# Patient Record
Sex: Female | Born: 1950 | Race: Black or African American | Hispanic: No | Smoking: Former smoker
Health system: Southern US, Community
[De-identification: ages and names within clinical notes are randomized; demographics above are authoritative.]

## PROBLEM LIST (undated history)

## (undated) DIAGNOSIS — M25552 Pain in left hip: Secondary | ICD-10-CM

## (undated) DIAGNOSIS — G5600 Carpal tunnel syndrome, unspecified upper limb: Secondary | ICD-10-CM

## (undated) DIAGNOSIS — B370 Candidal stomatitis: Secondary | ICD-10-CM

## (undated) DIAGNOSIS — G4730 Sleep apnea, unspecified: Secondary | ICD-10-CM

## (undated) DIAGNOSIS — N3281 Overactive bladder: Secondary | ICD-10-CM

## (undated) DIAGNOSIS — F329 Major depressive disorder, single episode, unspecified: Secondary | ICD-10-CM

## (undated) DIAGNOSIS — G40909 Epilepsy, unspecified, not intractable, without status epilepticus: Secondary | ICD-10-CM

## (undated) DIAGNOSIS — J40 Bronchitis, not specified as acute or chronic: Secondary | ICD-10-CM

## (undated) DIAGNOSIS — W19XXXA Unspecified fall, initial encounter: Secondary | ICD-10-CM

## (undated) DIAGNOSIS — D259 Leiomyoma of uterus, unspecified: Secondary | ICD-10-CM

## (undated) DIAGNOSIS — M1990 Unspecified osteoarthritis, unspecified site: Secondary | ICD-10-CM

## (undated) DIAGNOSIS — K589 Irritable bowel syndrome without diarrhea: Secondary | ICD-10-CM

## (undated) DIAGNOSIS — Z0000 Encounter for general adult medical examination without abnormal findings: Secondary | ICD-10-CM

## (undated) DIAGNOSIS — I499 Cardiac arrhythmia, unspecified: Secondary | ICD-10-CM

## (undated) DIAGNOSIS — D649 Anemia, unspecified: Secondary | ICD-10-CM

## (undated) DIAGNOSIS — G5620 Lesion of ulnar nerve, unspecified upper limb: Secondary | ICD-10-CM

## (undated) DIAGNOSIS — I10 Essential (primary) hypertension: Secondary | ICD-10-CM

## (undated) DIAGNOSIS — E119 Type 2 diabetes mellitus without complications: Secondary | ICD-10-CM

## (undated) DIAGNOSIS — E785 Hyperlipidemia, unspecified: Secondary | ICD-10-CM

## (undated) DIAGNOSIS — R32 Unspecified urinary incontinence: Secondary | ICD-10-CM

## (undated) DIAGNOSIS — S90121A Contusion of right lesser toe(s) without damage to nail, initial encounter: Secondary | ICD-10-CM

## (undated) DIAGNOSIS — F32A Depression, unspecified: Secondary | ICD-10-CM

## (undated) DIAGNOSIS — K219 Gastro-esophageal reflux disease without esophagitis: Secondary | ICD-10-CM

## (undated) DIAGNOSIS — Z79899 Other long term (current) drug therapy: Secondary | ICD-10-CM

## (undated) DIAGNOSIS — K120 Recurrent oral aphthae: Secondary | ICD-10-CM

## (undated) HISTORY — DX: Recurrent oral aphthae: K12.0

## (undated) HISTORY — PX: BREAST EXCISIONAL BIOPSY: SUR124

## (undated) HISTORY — DX: Hyperlipidemia, unspecified: E78.5

## (undated) HISTORY — DX: Cardiac arrhythmia, unspecified: I49.9

## (undated) HISTORY — DX: Pain in left hip: M25.552

## (undated) HISTORY — DX: Epilepsy, unspecified, not intractable, without status epilepticus: G40.909

## (undated) HISTORY — DX: Overactive bladder: N32.81

## (undated) HISTORY — DX: Irritable bowel syndrome, unspecified: K58.9

## (undated) HISTORY — DX: Lesion of ulnar nerve, unspecified upper limb: G56.20

## (undated) HISTORY — DX: Carpal tunnel syndrome, unspecified upper limb: G56.00

## (undated) HISTORY — DX: Bronchitis, not specified as acute or chronic: J40

## (undated) HISTORY — DX: Morbid (severe) obesity due to excess calories: E66.01

## (undated) HISTORY — DX: Anemia, unspecified: D64.9

## (undated) HISTORY — DX: Encounter for general adult medical examination without abnormal findings: Z00.00

## (undated) HISTORY — DX: Unspecified fall, initial encounter: W19.XXXA

## (undated) HISTORY — DX: Type 2 diabetes mellitus without complications: E11.9

## (undated) HISTORY — PX: KNEE ARTHROSCOPY: SUR90

## (undated) HISTORY — PX: CARPAL TUNNEL RELEASE: SHX101

## (undated) HISTORY — DX: Leiomyoma of uterus, unspecified: D25.9

## (undated) HISTORY — DX: Other long term (current) drug therapy: Z79.899

## (undated) HISTORY — PX: CHOLECYSTECTOMY: SHX55

## (undated) HISTORY — PX: DILATION AND CURETTAGE OF UTERUS: SHX78

## (undated) HISTORY — DX: Unspecified urinary incontinence: R32

## (undated) HISTORY — DX: Sleep apnea, unspecified: G47.30

## (undated) HISTORY — DX: Gastro-esophageal reflux disease without esophagitis: K21.9

## (undated) HISTORY — DX: Major depressive disorder, single episode, unspecified: F32.9

## (undated) HISTORY — DX: Depression, unspecified: F32.A

## (undated) HISTORY — DX: Contusion of right lesser toe(s) without damage to nail, initial encounter: S90.121A

## (undated) HISTORY — DX: Essential (primary) hypertension: I10

## (undated) HISTORY — DX: Unspecified osteoarthritis, unspecified site: M19.90

## (undated) HISTORY — PX: HEMORROIDECTOMY: SUR656

## (undated) HISTORY — DX: Candidal stomatitis: B37.0

## (undated) HISTORY — PX: HEEL SPUR SURGERY: SHX665

## (undated) HISTORY — PX: TONSILLECTOMY: SUR1361

---

## 1981-11-07 HISTORY — PX: NASAL SINUS SURGERY: SHX719

## 2000-02-16 ENCOUNTER — Ambulatory Visit (HOSPITAL_COMMUNITY): Admission: RE | Admit: 2000-02-16 | Discharge: 2000-02-16 | Payer: Self-pay | Admitting: Cardiology

## 2000-02-17 ENCOUNTER — Encounter: Payer: Self-pay | Admitting: Cardiology

## 2000-06-14 ENCOUNTER — Ambulatory Visit (HOSPITAL_BASED_OUTPATIENT_CLINIC_OR_DEPARTMENT_OTHER): Admission: RE | Admit: 2000-06-14 | Discharge: 2000-06-14 | Payer: Self-pay | Admitting: Cardiology

## 2001-09-07 ENCOUNTER — Ambulatory Visit (HOSPITAL_COMMUNITY): Admission: RE | Admit: 2001-09-07 | Discharge: 2001-09-08 | Payer: Self-pay | Admitting: *Deleted

## 2001-11-15 ENCOUNTER — Ambulatory Visit (HOSPITAL_COMMUNITY): Admission: RE | Admit: 2001-11-15 | Discharge: 2001-11-15 | Payer: Self-pay | Admitting: Family Medicine

## 2001-11-15 ENCOUNTER — Encounter: Payer: Self-pay | Admitting: Family Medicine

## 2002-10-14 ENCOUNTER — Ambulatory Visit (HOSPITAL_COMMUNITY): Admission: RE | Admit: 2002-10-14 | Discharge: 2002-10-14 | Payer: Self-pay | Admitting: Family Medicine

## 2002-10-14 ENCOUNTER — Encounter: Payer: Self-pay | Admitting: Family Medicine

## 2004-02-23 ENCOUNTER — Other Ambulatory Visit: Admission: RE | Admit: 2004-02-23 | Discharge: 2004-02-23 | Payer: Self-pay | Admitting: *Deleted

## 2004-03-04 ENCOUNTER — Ambulatory Visit (HOSPITAL_COMMUNITY): Admission: RE | Admit: 2004-03-04 | Discharge: 2004-03-04 | Payer: Self-pay | Admitting: Unknown Physician Specialty

## 2005-07-21 ENCOUNTER — Ambulatory Visit (HOSPITAL_COMMUNITY): Admission: RE | Admit: 2005-07-21 | Discharge: 2005-07-21 | Payer: Self-pay | Admitting: Family Medicine

## 2005-08-19 ENCOUNTER — Emergency Department (HOSPITAL_COMMUNITY): Admission: EM | Admit: 2005-08-19 | Discharge: 2005-08-19 | Payer: Self-pay | Admitting: *Deleted

## 2005-12-15 ENCOUNTER — Ambulatory Visit: Payer: Self-pay | Admitting: Orthopedic Surgery

## 2006-08-14 ENCOUNTER — Ambulatory Visit: Payer: Self-pay | Admitting: Family Medicine

## 2006-09-11 ENCOUNTER — Ambulatory Visit: Payer: Self-pay | Admitting: Family Medicine

## 2006-10-04 ENCOUNTER — Ambulatory Visit: Payer: Self-pay | Admitting: Family Medicine

## 2006-10-09 ENCOUNTER — Ambulatory Visit (HOSPITAL_COMMUNITY): Admission: RE | Admit: 2006-10-09 | Discharge: 2006-10-09 | Payer: Self-pay | Admitting: Family Medicine

## 2006-10-10 DIAGNOSIS — G5600 Carpal tunnel syndrome, unspecified upper limb: Secondary | ICD-10-CM | POA: Insufficient documentation

## 2006-10-10 DIAGNOSIS — D259 Leiomyoma of uterus, unspecified: Secondary | ICD-10-CM | POA: Insufficient documentation

## 2006-10-10 DIAGNOSIS — J45909 Unspecified asthma, uncomplicated: Secondary | ICD-10-CM | POA: Insufficient documentation

## 2006-10-10 DIAGNOSIS — E785 Hyperlipidemia, unspecified: Secondary | ICD-10-CM | POA: Insufficient documentation

## 2006-10-10 DIAGNOSIS — N318 Other neuromuscular dysfunction of bladder: Secondary | ICD-10-CM | POA: Insufficient documentation

## 2006-10-10 DIAGNOSIS — K219 Gastro-esophageal reflux disease without esophagitis: Secondary | ICD-10-CM | POA: Insufficient documentation

## 2006-10-10 DIAGNOSIS — J309 Allergic rhinitis, unspecified: Secondary | ICD-10-CM | POA: Insufficient documentation

## 2006-10-10 DIAGNOSIS — I10 Essential (primary) hypertension: Secondary | ICD-10-CM | POA: Insufficient documentation

## 2006-10-10 DIAGNOSIS — M129 Arthropathy, unspecified: Secondary | ICD-10-CM | POA: Insufficient documentation

## 2006-10-10 DIAGNOSIS — K589 Irritable bowel syndrome without diarrhea: Secondary | ICD-10-CM | POA: Insufficient documentation

## 2006-10-10 DIAGNOSIS — H269 Unspecified cataract: Secondary | ICD-10-CM | POA: Insufficient documentation

## 2006-11-15 ENCOUNTER — Ambulatory Visit: Payer: Self-pay | Admitting: Family Medicine

## 2006-11-16 ENCOUNTER — Encounter (INDEPENDENT_AMBULATORY_CARE_PROVIDER_SITE_OTHER): Payer: Self-pay | Admitting: Family Medicine

## 2006-11-16 LAB — CONVERTED CEMR LAB
AST: 17 units/L (ref 0–37)
Albumin: 4.5 g/dL (ref 3.5–5.2)
CO2: 20 meq/L (ref 19–32)
Calcium: 9.5 mg/dL (ref 8.4–10.5)
Chloride: 103 meq/L (ref 96–112)
Creatinine, Ser: 0.72 mg/dL (ref 0.40–1.20)
Potassium: 4.3 meq/L (ref 3.5–5.3)
Sodium: 141 meq/L (ref 135–145)
Total Bilirubin: 0.6 mg/dL (ref 0.3–1.2)

## 2006-12-27 ENCOUNTER — Ambulatory Visit: Payer: Self-pay | Admitting: Family Medicine

## 2006-12-27 DIAGNOSIS — E119 Type 2 diabetes mellitus without complications: Secondary | ICD-10-CM | POA: Insufficient documentation

## 2006-12-27 LAB — CONVERTED CEMR LAB: Hgb A1c MFr Bld: 6.1 %

## 2007-01-03 ENCOUNTER — Encounter (INDEPENDENT_AMBULATORY_CARE_PROVIDER_SITE_OTHER): Payer: Self-pay | Admitting: Family Medicine

## 2007-01-09 ENCOUNTER — Ambulatory Visit: Payer: Self-pay | Admitting: Family Medicine

## 2007-01-09 ENCOUNTER — Telehealth (INDEPENDENT_AMBULATORY_CARE_PROVIDER_SITE_OTHER): Payer: Self-pay | Admitting: Family Medicine

## 2007-01-09 LAB — CONVERTED CEMR LAB
Cholesterol: 178 mg/dL (ref 0–200)
LDL Cholesterol: 117 mg/dL — ABNORMAL HIGH (ref 0–99)
Total CHOL/HDL Ratio: 4.2
Triglycerides: 93 mg/dL (ref <150)
VLDL: 19 mg/dL (ref 0–40)

## 2007-01-29 ENCOUNTER — Encounter (INDEPENDENT_AMBULATORY_CARE_PROVIDER_SITE_OTHER): Payer: Self-pay | Admitting: Family Medicine

## 2007-02-22 ENCOUNTER — Ambulatory Visit: Payer: Self-pay | Admitting: Family Medicine

## 2007-02-22 LAB — CONVERTED CEMR LAB
Glucose, Bld: 118 mg/dL
Hgb A1c MFr Bld: 5.9 %

## 2007-04-16 ENCOUNTER — Telehealth (INDEPENDENT_AMBULATORY_CARE_PROVIDER_SITE_OTHER): Payer: Self-pay | Admitting: Family Medicine

## 2007-04-20 ENCOUNTER — Ambulatory Visit (HOSPITAL_COMMUNITY): Admission: RE | Admit: 2007-04-20 | Discharge: 2007-04-20 | Payer: Self-pay | Admitting: Family Medicine

## 2007-04-20 ENCOUNTER — Ambulatory Visit: Payer: Self-pay | Admitting: Cardiology

## 2007-04-20 ENCOUNTER — Ambulatory Visit: Payer: Self-pay | Admitting: Family Medicine

## 2007-04-23 ENCOUNTER — Encounter (INDEPENDENT_AMBULATORY_CARE_PROVIDER_SITE_OTHER): Payer: Self-pay | Admitting: Family Medicine

## 2007-04-27 ENCOUNTER — Telehealth (INDEPENDENT_AMBULATORY_CARE_PROVIDER_SITE_OTHER): Payer: Self-pay | Admitting: *Deleted

## 2007-04-27 ENCOUNTER — Ambulatory Visit: Payer: Self-pay | Admitting: Family Medicine

## 2007-04-30 ENCOUNTER — Ambulatory Visit (HOSPITAL_COMMUNITY): Admission: RE | Admit: 2007-04-30 | Discharge: 2007-04-30 | Payer: Self-pay | Admitting: Family Medicine

## 2007-05-03 ENCOUNTER — Encounter (INDEPENDENT_AMBULATORY_CARE_PROVIDER_SITE_OTHER): Payer: Self-pay | Admitting: Family Medicine

## 2007-05-04 ENCOUNTER — Encounter (HOSPITAL_COMMUNITY): Admission: RE | Admit: 2007-05-04 | Discharge: 2007-06-03 | Payer: Self-pay | Admitting: Cardiology

## 2007-05-04 ENCOUNTER — Ambulatory Visit: Payer: Self-pay | Admitting: Cardiology

## 2007-05-04 ENCOUNTER — Ambulatory Visit: Payer: Self-pay | Admitting: Cardiovascular Disease

## 2007-05-09 ENCOUNTER — Ambulatory Visit: Payer: Self-pay | Admitting: Family Medicine

## 2007-06-11 ENCOUNTER — Ambulatory Visit: Payer: Self-pay | Admitting: Family Medicine

## 2007-06-11 ENCOUNTER — Ambulatory Visit: Payer: Self-pay | Admitting: Cardiology

## 2007-06-11 LAB — CONVERTED CEMR LAB: Hgb A1c MFr Bld: 6 %

## 2007-09-11 ENCOUNTER — Ambulatory Visit: Payer: Self-pay | Admitting: Family Medicine

## 2007-09-11 ENCOUNTER — Telehealth (INDEPENDENT_AMBULATORY_CARE_PROVIDER_SITE_OTHER): Payer: Self-pay | Admitting: *Deleted

## 2007-09-11 LAB — CONVERTED CEMR LAB
Glucose, Bld: 123 mg/dL
Hgb A1c MFr Bld: 6.2 %

## 2007-09-17 ENCOUNTER — Encounter (INDEPENDENT_AMBULATORY_CARE_PROVIDER_SITE_OTHER): Payer: Self-pay | Admitting: Family Medicine

## 2007-09-17 ENCOUNTER — Ambulatory Visit: Payer: Self-pay | Admitting: Cardiology

## 2007-09-18 ENCOUNTER — Encounter (INDEPENDENT_AMBULATORY_CARE_PROVIDER_SITE_OTHER): Payer: Self-pay | Admitting: Family Medicine

## 2007-10-23 ENCOUNTER — Telehealth (INDEPENDENT_AMBULATORY_CARE_PROVIDER_SITE_OTHER): Payer: Self-pay | Admitting: *Deleted

## 2007-10-23 ENCOUNTER — Other Ambulatory Visit: Admission: RE | Admit: 2007-10-23 | Discharge: 2007-10-23 | Payer: Self-pay | Admitting: Family Medicine

## 2007-10-23 ENCOUNTER — Ambulatory Visit: Payer: Self-pay | Admitting: Family Medicine

## 2007-10-23 ENCOUNTER — Encounter (INDEPENDENT_AMBULATORY_CARE_PROVIDER_SITE_OTHER): Payer: Self-pay | Admitting: Family Medicine

## 2007-10-23 LAB — CONVERTED CEMR LAB: Pap Smear: NORMAL

## 2007-10-25 ENCOUNTER — Ambulatory Visit (HOSPITAL_COMMUNITY): Admission: RE | Admit: 2007-10-25 | Discharge: 2007-10-25 | Payer: Self-pay | Admitting: Family Medicine

## 2007-10-26 ENCOUNTER — Telehealth (INDEPENDENT_AMBULATORY_CARE_PROVIDER_SITE_OTHER): Payer: Self-pay | Admitting: *Deleted

## 2007-10-26 LAB — CONVERTED CEMR LAB
ALT: 8 units/L (ref 0–35)
AST: 15 units/L (ref 0–37)
Albumin: 4.4 g/dL (ref 3.5–5.2)
BUN: 43 mg/dL — ABNORMAL HIGH (ref 6–23)
Cholesterol: 163 mg/dL (ref 0–200)
Creatinine, Ser: 1.29 mg/dL — ABNORMAL HIGH (ref 0.40–1.20)
Eosinophils Relative: 3 % (ref 0–5)
Glucose, Bld: 81 mg/dL (ref 70–99)
Hemoglobin: 12.6 g/dL (ref 12.0–15.0)
LDL Cholesterol: 80 mg/dL (ref 0–99)
Lymphocytes Relative: 45 % (ref 12–46)
MCHC: 32.1 g/dL (ref 30.0–36.0)
Monocytes Absolute: 0.5 10*3/uL (ref 0.1–1.0)
Monocytes Relative: 6 % (ref 3–12)
Neutrophils Relative %: 46 % (ref 43–77)
RBC: 4.18 M/uL (ref 3.87–5.11)
RDW: 13.5 % (ref 11.5–15.5)
TSH: 3.429 microintl units/mL (ref 0.350–5.50)
Triglycerides: 137 mg/dL (ref <150)
WBC: 7.6 10*3/uL (ref 4.0–10.5)

## 2007-10-30 ENCOUNTER — Telehealth (INDEPENDENT_AMBULATORY_CARE_PROVIDER_SITE_OTHER): Payer: Self-pay | Admitting: *Deleted

## 2007-12-11 ENCOUNTER — Encounter (INDEPENDENT_AMBULATORY_CARE_PROVIDER_SITE_OTHER): Payer: Self-pay | Admitting: Family Medicine

## 2007-12-17 ENCOUNTER — Encounter (INDEPENDENT_AMBULATORY_CARE_PROVIDER_SITE_OTHER): Payer: Self-pay | Admitting: Family Medicine

## 2007-12-24 ENCOUNTER — Ambulatory Visit: Payer: Self-pay | Admitting: Family Medicine

## 2007-12-24 LAB — CONVERTED CEMR LAB: Hgb A1c MFr Bld: 6 %

## 2008-01-03 ENCOUNTER — Encounter (INDEPENDENT_AMBULATORY_CARE_PROVIDER_SITE_OTHER): Payer: Self-pay | Admitting: Family Medicine

## 2008-01-16 ENCOUNTER — Ambulatory Visit: Payer: Self-pay | Admitting: Family Medicine

## 2008-01-29 ENCOUNTER — Encounter (INDEPENDENT_AMBULATORY_CARE_PROVIDER_SITE_OTHER): Payer: Self-pay | Admitting: Family Medicine

## 2008-03-17 ENCOUNTER — Ambulatory Visit: Payer: Self-pay | Admitting: Family Medicine

## 2008-03-17 LAB — CONVERTED CEMR LAB
Bilirubin Urine: NEGATIVE
Ketones, urine, test strip: NEGATIVE
Nitrite: NEGATIVE
Protein, U semiquant: NEGATIVE

## 2008-03-18 ENCOUNTER — Telehealth (INDEPENDENT_AMBULATORY_CARE_PROVIDER_SITE_OTHER): Payer: Self-pay | Admitting: *Deleted

## 2008-03-18 ENCOUNTER — Encounter (INDEPENDENT_AMBULATORY_CARE_PROVIDER_SITE_OTHER): Payer: Self-pay | Admitting: Family Medicine

## 2008-03-18 LAB — CONVERTED CEMR LAB
ALT: 11 units/L (ref 0–35)
Albumin: 4.6 g/dL (ref 3.5–5.2)
Calcium: 10 mg/dL (ref 8.4–10.5)
Chloride: 101 meq/L (ref 96–112)
Creatinine, Ser: 1.14 mg/dL (ref 0.40–1.20)
Glucose, Bld: 85 mg/dL (ref 70–99)
Sodium: 138 meq/L (ref 135–145)
Total Bilirubin: 0.4 mg/dL (ref 0.3–1.2)

## 2008-03-29 ENCOUNTER — Telehealth (INDEPENDENT_AMBULATORY_CARE_PROVIDER_SITE_OTHER): Payer: Self-pay | Admitting: Family Medicine

## 2008-03-29 ENCOUNTER — Emergency Department (HOSPITAL_COMMUNITY): Admission: EM | Admit: 2008-03-29 | Discharge: 2008-03-29 | Payer: Self-pay | Admitting: Emergency Medicine

## 2008-04-07 ENCOUNTER — Ambulatory Visit: Payer: Self-pay | Admitting: Family Medicine

## 2008-06-10 ENCOUNTER — Ambulatory Visit: Payer: Self-pay | Admitting: Family Medicine

## 2008-06-17 ENCOUNTER — Encounter (INDEPENDENT_AMBULATORY_CARE_PROVIDER_SITE_OTHER): Payer: Self-pay | Admitting: Family Medicine

## 2008-06-23 ENCOUNTER — Ambulatory Visit: Payer: Self-pay | Admitting: Family Medicine

## 2008-06-24 ENCOUNTER — Encounter (INDEPENDENT_AMBULATORY_CARE_PROVIDER_SITE_OTHER): Payer: Self-pay | Admitting: Family Medicine

## 2008-06-25 LAB — CONVERTED CEMR LAB
BUN: 19 mg/dL (ref 6–23)
Basophils Absolute: 0 10*3/uL (ref 0.0–0.1)
Chloride: 105 meq/L (ref 96–112)
Eosinophils Absolute: 0.2 10*3/uL (ref 0.0–0.7)
Eosinophils Relative: 4 % (ref 0–5)
Folate: >20 ng/mL
Lymphocytes Relative: 37 % (ref 12–46)
Lymphs Abs: 1.9 10*3/uL (ref 0.7–4.0)
Monocytes Absolute: 0.4 10*3/uL (ref 0.1–1.0)
Monocytes Relative: 7 % (ref 3–12)
Platelets: 132 10*3/uL — ABNORMAL LOW (ref 150–400)
Potassium: 4.2 meq/L (ref 3.5–5.3)
TSH: 2.161 microintl units/mL (ref 0.350–4.50)

## 2008-07-07 ENCOUNTER — Ambulatory Visit: Payer: Self-pay | Admitting: Family Medicine

## 2008-09-16 ENCOUNTER — Encounter (INDEPENDENT_AMBULATORY_CARE_PROVIDER_SITE_OTHER): Payer: Self-pay | Admitting: Family Medicine

## 2008-11-12 ENCOUNTER — Ambulatory Visit: Payer: Self-pay | Admitting: Family Medicine

## 2008-11-12 ENCOUNTER — Encounter (INDEPENDENT_AMBULATORY_CARE_PROVIDER_SITE_OTHER): Payer: Self-pay | Admitting: Family Medicine

## 2008-11-12 ENCOUNTER — Other Ambulatory Visit: Admission: RE | Admit: 2008-11-12 | Discharge: 2008-11-12 | Payer: Self-pay | Admitting: Family Medicine

## 2008-11-12 ENCOUNTER — Ambulatory Visit (HOSPITAL_COMMUNITY): Admission: RE | Admit: 2008-11-12 | Discharge: 2008-11-12 | Payer: Self-pay | Admitting: Family Medicine

## 2008-11-12 ENCOUNTER — Encounter: Payer: Self-pay | Admitting: Orthopedic Surgery

## 2008-11-12 LAB — CONVERTED CEMR LAB
Glucose, Urine, Semiquant: NEGATIVE
Hgb A1c MFr Bld: 5.8 %
Ketones, urine, test strip: NEGATIVE
Nitrite: NEGATIVE
Protein, U semiquant: NEGATIVE

## 2008-11-13 ENCOUNTER — Encounter (INDEPENDENT_AMBULATORY_CARE_PROVIDER_SITE_OTHER): Payer: Self-pay | Admitting: Family Medicine

## 2008-11-13 LAB — CONVERTED CEMR LAB
ALT: 17 units/L (ref 0–35)
Albumin: 4.5 g/dL (ref 3.5–5.2)
BUN: 40 mg/dL — ABNORMAL HIGH (ref 6–23)
Calcium: 10.2 mg/dL (ref 8.4–10.5)
Creatinine, Ser: 1.44 mg/dL — ABNORMAL HIGH (ref 0.40–1.20)
Eosinophils Relative: 3 % (ref 0–5)
HCT: 37.5 % (ref 36.0–46.0)
HDL: 55 mg/dL (ref >39)
LDL Cholesterol: 91 mg/dL (ref 0–99)
Lymphocytes Relative: 33 % (ref 12–46)
Lymphs Abs: 1.8 10*3/uL (ref 0.7–4.0)
MCHC: 31.2 g/dL (ref 30.0–36.0)
MCV: 94.7 fL (ref 78.0–100.0)
Microalb Creat Ratio: 3.7 mg/g (ref 0.0–30.0)
Monocytes Absolute: 0.4 10*3/uL (ref 0.1–1.0)
Monocytes Relative: 7 % (ref 3–12)
RDW: 13.7 % (ref 11.5–15.5)
Sodium: 141 meq/L (ref 135–145)
Total Protein: 7.6 g/dL (ref 6.0–8.3)

## 2008-11-20 ENCOUNTER — Encounter (INDEPENDENT_AMBULATORY_CARE_PROVIDER_SITE_OTHER): Payer: Self-pay | Admitting: Family Medicine

## 2008-11-25 ENCOUNTER — Ambulatory Visit (HOSPITAL_COMMUNITY): Admission: RE | Admit: 2008-11-25 | Discharge: 2008-11-25 | Payer: Self-pay | Admitting: Family Medicine

## 2008-11-26 ENCOUNTER — Ambulatory Visit: Payer: Self-pay | Admitting: Family Medicine

## 2008-11-27 ENCOUNTER — Encounter (INDEPENDENT_AMBULATORY_CARE_PROVIDER_SITE_OTHER): Payer: Self-pay | Admitting: Family Medicine

## 2008-11-27 LAB — CONVERTED CEMR LAB
BUN: 32 mg/dL — ABNORMAL HIGH (ref 6–23)
Calcium: 9 mg/dL (ref 8.4–10.5)
Chloride: 104 meq/L (ref 96–112)
Glucose, Bld: 90 mg/dL (ref 70–99)
Potassium: 5.1 meq/L (ref 3.5–5.3)

## 2008-12-01 ENCOUNTER — Telehealth (INDEPENDENT_AMBULATORY_CARE_PROVIDER_SITE_OTHER): Payer: Self-pay | Admitting: *Deleted

## 2008-12-10 ENCOUNTER — Ambulatory Visit: Payer: Self-pay | Admitting: Family Medicine

## 2009-01-07 ENCOUNTER — Ambulatory Visit: Payer: Self-pay | Admitting: Family Medicine

## 2009-01-08 ENCOUNTER — Telehealth (INDEPENDENT_AMBULATORY_CARE_PROVIDER_SITE_OTHER): Payer: Self-pay | Admitting: *Deleted

## 2009-01-09 ENCOUNTER — Ambulatory Visit: Payer: Self-pay | Admitting: Cardiology

## 2009-01-19 ENCOUNTER — Ambulatory Visit: Payer: Self-pay | Admitting: Cardiology

## 2009-01-19 ENCOUNTER — Encounter: Payer: Self-pay | Admitting: Cardiology

## 2009-01-19 ENCOUNTER — Ambulatory Visit (HOSPITAL_COMMUNITY): Admission: RE | Admit: 2009-01-19 | Discharge: 2009-01-19 | Payer: Self-pay | Admitting: Cardiology

## 2009-02-04 ENCOUNTER — Ambulatory Visit: Payer: Self-pay | Admitting: Family Medicine

## 2009-02-05 ENCOUNTER — Encounter (INDEPENDENT_AMBULATORY_CARE_PROVIDER_SITE_OTHER): Payer: Self-pay | Admitting: Family Medicine

## 2009-02-05 LAB — CONVERTED CEMR LAB
CO2: 21 meq/L (ref 19–32)
Glucose, Bld: 70 mg/dL (ref 70–99)
Sodium: 142 meq/L (ref 135–145)

## 2009-02-11 ENCOUNTER — Ambulatory Visit: Payer: Self-pay | Admitting: Cardiology

## 2009-03-04 ENCOUNTER — Ambulatory Visit: Payer: Self-pay | Admitting: Family Medicine

## 2009-03-04 LAB — CONVERTED CEMR LAB
Glucose, Bld: 110 mg/dL
Hgb A1c MFr Bld: 5.4 %

## 2009-04-20 ENCOUNTER — Telehealth (INDEPENDENT_AMBULATORY_CARE_PROVIDER_SITE_OTHER): Payer: Self-pay | Admitting: Family Medicine

## 2009-05-28 ENCOUNTER — Ambulatory Visit: Payer: Self-pay | Admitting: Orthopedic Surgery

## 2009-05-28 DIAGNOSIS — IMO0002 Reserved for concepts with insufficient information to code with codable children: Secondary | ICD-10-CM | POA: Insufficient documentation

## 2009-05-28 DIAGNOSIS — M1710 Unilateral primary osteoarthritis, unspecified knee: Secondary | ICD-10-CM

## 2009-06-03 ENCOUNTER — Ambulatory Visit: Payer: Self-pay | Admitting: Family Medicine

## 2009-06-04 LAB — CONVERTED CEMR LAB
ALT: 16 units/L (ref 0–35)
AST: 19 units/L (ref 0–37)
Albumin: 4.2 g/dL (ref 3.5–5.2)
Alkaline Phosphatase: 54 units/L (ref 39–117)
BUN: 16 mg/dL (ref 6–23)
CO2: 23 meq/L (ref 19–32)
Calcium: 9.3 mg/dL (ref 8.4–10.5)
Chloride: 107 meq/L (ref 96–112)
Cholesterol: 144 mg/dL (ref 0–200)
Creatinine, Ser: 0.91 mg/dL (ref 0.40–1.20)
Glucose, Bld: 80 mg/dL (ref 70–99)
HDL: 61 mg/dL (ref >39)
LDL Cholesterol: 64 mg/dL (ref 0–99)
Potassium: 4.7 meq/L (ref 3.5–5.3)
Sodium: 140 meq/L (ref 135–145)
Total Bilirubin: 0.4 mg/dL (ref 0.3–1.2)
Total CHOL/HDL Ratio: 2.4
Total Protein: 6.8 g/dL (ref 6.0–8.3)
Triglycerides: 96 mg/dL (ref <150)
VLDL: 19 mg/dL (ref 0–40)

## 2009-06-10 ENCOUNTER — Encounter (INDEPENDENT_AMBULATORY_CARE_PROVIDER_SITE_OTHER): Payer: Self-pay | Admitting: Family Medicine

## 2009-06-15 ENCOUNTER — Ambulatory Visit (HOSPITAL_COMMUNITY): Admission: RE | Admit: 2009-06-15 | Discharge: 2009-06-15 | Payer: Self-pay | Admitting: Orthopedic Surgery

## 2009-06-23 ENCOUNTER — Encounter (INDEPENDENT_AMBULATORY_CARE_PROVIDER_SITE_OTHER): Payer: Self-pay | Admitting: Family Medicine

## 2009-06-23 ENCOUNTER — Ambulatory Visit (HOSPITAL_COMMUNITY): Admission: RE | Admit: 2009-06-23 | Discharge: 2009-06-24 | Payer: Self-pay | Admitting: Orthopedic Surgery

## 2009-07-20 ENCOUNTER — Encounter (HOSPITAL_COMMUNITY): Admission: RE | Admit: 2009-07-20 | Discharge: 2009-08-06 | Payer: Self-pay | Admitting: Thoracic Diseases

## 2009-07-22 ENCOUNTER — Ambulatory Visit: Payer: Self-pay | Admitting: Family Medicine

## 2009-07-23 ENCOUNTER — Encounter (INDEPENDENT_AMBULATORY_CARE_PROVIDER_SITE_OTHER): Payer: Self-pay | Admitting: Family Medicine

## 2009-07-23 ENCOUNTER — Telehealth (INDEPENDENT_AMBULATORY_CARE_PROVIDER_SITE_OTHER): Payer: Self-pay | Admitting: *Deleted

## 2009-07-23 LAB — CONVERTED CEMR LAB
Basophils Relative: 0 % (ref 0–1)
Eosinophils Absolute: 0.1 10*3/uL (ref 0.0–0.7)
Eosinophils Relative: 2 % (ref 0–5)
HCT: 33.9 % — ABNORMAL LOW (ref 36.0–46.0)
MCHC: 32.2 g/dL (ref 30.0–36.0)
MCV: 90.9 fL (ref 78.0–100.0)
Neutrophils Relative %: 66 % (ref 43–77)
Platelets: 136 10*3/uL — ABNORMAL LOW (ref 150–400)
RDW: 13.8 % (ref 11.5–15.5)

## 2009-07-30 ENCOUNTER — Encounter (INDEPENDENT_AMBULATORY_CARE_PROVIDER_SITE_OTHER): Payer: Self-pay | Admitting: Family Medicine

## 2009-08-07 ENCOUNTER — Encounter (HOSPITAL_COMMUNITY): Admission: RE | Admit: 2009-08-07 | Discharge: 2009-09-06 | Payer: Self-pay | Admitting: Orthopedic Surgery

## 2009-08-15 ENCOUNTER — Emergency Department (HOSPITAL_COMMUNITY): Admission: EM | Admit: 2009-08-15 | Discharge: 2009-08-15 | Payer: Self-pay | Admitting: Emergency Medicine

## 2009-12-24 ENCOUNTER — Ambulatory Visit (HOSPITAL_COMMUNITY): Admission: RE | Admit: 2009-12-24 | Discharge: 2009-12-24 | Payer: Self-pay | Admitting: Family Medicine

## 2010-08-25 ENCOUNTER — Emergency Department (HOSPITAL_COMMUNITY): Admission: EM | Admit: 2010-08-25 | Discharge: 2010-08-25 | Payer: Self-pay | Admitting: Emergency Medicine

## 2010-12-05 LAB — CONVERTED CEMR LAB
Alkaline Phosphatase: 74 units/L (ref 39–117)
BUN: 24 mg/dL — ABNORMAL HIGH (ref 6–23)
Calcium: 10 mg/dL (ref 8.4–10.5)
Cholesterol: 169 mg/dL (ref 0–200)
LDL Cholesterol: 98 mg/dL (ref 0–99)
Total CHOL/HDL Ratio: 3.3
Total Protein: 7.8 g/dL (ref 6.0–8.3)
Triglycerides: 97 mg/dL (ref <150)
VLDL: 19 mg/dL (ref 0–40)

## 2010-12-16 ENCOUNTER — Other Ambulatory Visit (HOSPITAL_COMMUNITY): Payer: Self-pay | Admitting: Family Medicine

## 2010-12-16 DIAGNOSIS — Z139 Encounter for screening, unspecified: Secondary | ICD-10-CM

## 2010-12-27 ENCOUNTER — Ambulatory Visit (HOSPITAL_COMMUNITY): Payer: Medicare Other

## 2010-12-30 ENCOUNTER — Ambulatory Visit (HOSPITAL_COMMUNITY)
Admission: RE | Admit: 2010-12-30 | Discharge: 2010-12-30 | Disposition: A | Discharge status: Home / Self Care | Payer: Medicare Other | Source: Ambulatory Visit | Attending: Family Medicine | Admitting: Family Medicine

## 2010-12-30 DIAGNOSIS — Z139 Encounter for screening, unspecified: Secondary | ICD-10-CM

## 2010-12-30 DIAGNOSIS — Z1231 Encounter for screening mammogram for malignant neoplasm of breast: Secondary | ICD-10-CM | POA: Insufficient documentation

## 2011-01-19 LAB — BASIC METABOLIC PANEL
BUN: 30 mg/dL — ABNORMAL HIGH (ref 6–23)
Calcium: 9.5 mg/dL (ref 8.4–10.5)
GFR calc non Af Amer: 53 mL/min — ABNORMAL LOW (ref >60)
Glucose, Bld: 107 mg/dL — ABNORMAL HIGH (ref 70–99)
Potassium: 4.4 mEq/L (ref 3.5–5.1)
Sodium: 139 mEq/L (ref 135–145)

## 2011-01-19 LAB — DIFFERENTIAL
Basophils Absolute: 0 10*3/uL (ref 0.0–0.1)
Basophils Relative: 0 % (ref 0–1)
Lymphocytes Relative: 28 % (ref 12–46)
Neutro Abs: 3.7 10*3/uL (ref 1.7–7.7)
Neutrophils Relative %: 62 % (ref 43–77)

## 2011-01-19 LAB — CBC
MCHC: 32.8 g/dL (ref 30.0–36.0)
RDW: 14 % (ref 11.5–15.5)
WBC: 5.9 10*3/uL (ref 4.0–10.5)

## 2011-02-12 LAB — COMPREHENSIVE METABOLIC PANEL WITH GFR
ALT: 12 U/L (ref 0–35)
AST: 20 U/L (ref 0–37)
Albumin: 4.3 g/dL (ref 3.5–5.2)
Alkaline Phosphatase: 62 U/L (ref 39–117)
BUN: 17 mg/dL (ref 6–23)
CO2: 25 meq/L (ref 19–32)
Calcium: 9.6 mg/dL (ref 8.4–10.5)
Chloride: 106 meq/L (ref 96–112)
Creatinine, Ser: 0.98 mg/dL (ref 0.4–1.2)
GFR calc non Af Amer: 58 mL/min — ABNORMAL LOW
Glucose, Bld: 89 mg/dL (ref 70–99)
Potassium: 4.5 meq/L (ref 3.5–5.1)
Sodium: 138 meq/L (ref 135–145)
Total Bilirubin: 0.9 mg/dL (ref 0.3–1.2)
Total Protein: 6.9 g/dL (ref 6.0–8.3)

## 2011-02-12 LAB — GLUCOSE, CAPILLARY
Glucose-Capillary: 109 mg/dL — ABNORMAL HIGH (ref 70–99)
Glucose-Capillary: 69 mg/dL — ABNORMAL LOW (ref 70–99)
Glucose-Capillary: 83 mg/dL (ref 70–99)
Glucose-Capillary: 83 mg/dL (ref 70–99)

## 2011-02-12 LAB — URINALYSIS, ROUTINE W REFLEX MICROSCOPIC
Bilirubin Urine: NEGATIVE
Glucose, UA: NEGATIVE mg/dL
Hgb urine dipstick: NEGATIVE
Ketones, ur: NEGATIVE mg/dL
Nitrite: NEGATIVE
Specific Gravity, Urine: 1.012 (ref 1.005–1.030)
pH: 5.5 (ref 5.0–8.0)

## 2011-02-12 LAB — BASIC METABOLIC PANEL
GFR calc Af Amer: >60 mL/min (ref >60)
GFR calc non Af Amer: >60 mL/min (ref >60)
Potassium: 4.1 mEq/L (ref 3.5–5.1)
Sodium: 139 mEq/L (ref 135–145)

## 2011-02-12 LAB — URINE CULTURE
Colony Count: NO GROWTH
Culture: NO GROWTH

## 2011-02-12 LAB — CBC
HCT: 30.5 % — ABNORMAL LOW (ref 36.0–46.0)
HCT: 33 % — ABNORMAL LOW (ref 36.0–46.0)
Hemoglobin: 10.4 g/dL — ABNORMAL LOW (ref 12.0–15.0)
Hemoglobin: 11.2 g/dL — ABNORMAL LOW (ref 12.0–15.0)
MCHC: 33.9 g/dL (ref 30.0–36.0)
MCV: 90.9 fL (ref 78.0–100.0)
Platelets: 114 K/uL — ABNORMAL LOW (ref 150–400)
RBC: 3.63 MIL/uL — ABNORMAL LOW (ref 3.87–5.11)
RDW: 15 % (ref 11.5–15.5)
WBC: 6.7 10*3/uL (ref 4.0–10.5)
WBC: 6.4 K/uL (ref 4.0–10.5)

## 2011-02-12 LAB — DIFFERENTIAL
Basophils Absolute: 0 K/uL (ref 0.0–0.1)
Basophils Relative: 1 % (ref 0–1)
Eosinophils Absolute: 0.2 K/uL (ref 0.0–0.7)
Eosinophils Relative: 3 % (ref 0–5)
Lymphocytes Relative: 28 % (ref 12–46)
Lymphs Abs: 1.8 K/uL (ref 0.7–4.0)
Monocytes Absolute: 0.3 K/uL (ref 0.1–1.0)
Monocytes Relative: 5 % (ref 3–12)
Neutro Abs: 4.1 K/uL (ref 1.7–7.7)
Neutrophils Relative %: 63 % (ref 43–77)

## 2011-02-12 LAB — PROTIME-INR
INR: 1.1 (ref 0.00–1.49)
Prothrombin Time: 14.2 s (ref 11.6–15.2)

## 2011-02-12 LAB — APTT: aPTT: 33 s (ref 24–37)

## 2011-03-22 NOTE — Assessment & Plan Note (Signed)
[facility]                       [place] CARDIOLOGY OFFICE NOTE   Felicia Wright, Felicia Wright                     MRN:          [id number]  DATE:01/09/2009                            DOB:          February 14, 1951    CARDIOLOGIST:  Gerrit Friends. Dietrich Pates, MD, [facility]   PRIMARY CARE PHYSICIAN:  Franchot Heidelberg, MD   REASON FOR VISIT:  Chest pain.   HISTORY OF PRESENT ILLNESS:  Felicia Wright is a 60 year old female with a  history of diabetes mellitus, hypertension, hyperlipidemia as well as  morbid obesity and prior tobacco abuse who has been evaluated for chest  pain in the past with stress testing.  In June 2008, she underwent  adenosine Myoview study that demonstrated breast attenuation and  possible mild anterior apical wall ischemia.  EF was 52%.  This was felt  to be a low-risk study, and she was treated medically at that time.  She  has done well since that point in time and has actually lost a  considerable amount of weight over the last couple of years.  She works  out in the pool 4 times a week doing [employer].  She has had a lot  of her medications decreased over the last several months secondary to  lower blood pressures, as she loses weight.  She notes that her primary  care physician recently took her off of metoprolol.  She apparently had  a lot of fatigue and bradycardia.  Shortly after this, she noted an  episode of chest discomfort that lasted just seconds.  It started in her  jaw and radiated down into her chest and across both of her shoulders.  She took her blood pressure and it was low at both of these occasions.  The lowest was 99/48.  She did note some nausea but denies any  diaphoresis or associated shortness of breath.  She has noted several  episodes of dizziness and lightheadedness.  She denies syncope or near  syncope.  Most of these episodes occur when she gets out of the pool  from doing her exercises.  She does note some tachy  palpitations.  She  has checked her blood pressure and it was 88/54 at its lowest.  She saw  Dr. Erby Pian recently.  He was concerned about her symptoms and sent her  back to see [place].  He also placed her back on a lower dosage of  metoprolol.  She notes that since starting this medication, she has more  fatigue.  She denies any orthopnea, PND, or pedal edema.  She denies any  exertional chest discomfort or exertional shortness of breath.  When she  gets dizzy upon coming out of the pool, she does note tightness in her  chest that lasts just briefly (minutes).   CURRENT MEDICATIONS:  Multivitamin daily, Potassium 20 mEq daily, Lasix  80 mg half tablet daily, Aspirin 81 mg daily, Lisinopril 10 mg daily,  Omeprazole 20 mg daily, Zetia 10 mg daily,  Prozac 20 mg daily, Metoprolol 25 mg half tablet b.i.d., Pravastatin 80  mg daily, Tylenol p.r.n., Naprosyn p.r.n.   ALLERGIES:  She is intolerant to LIPITOR and FLUVASTATIN.   SOCIAL HISTORY:  She quit smoking many years ago after a 20-pack-year  history of smoking.   REVIEW OF SYSTEMS:  Please see HPI.  Denies fevers, chills, cough,  melena, hematochezia, hematuria, dysuria.  Rest of the review of systems  are negative.   PHYSICAL EXAMINATION:  GENERAL:  She is a well-nourished, well-developed  female.  VITAL SIGNS:  Blood pressure 116/63, pulse 60, weight 272 pounds.  Her  last weight recorded in our office was 327 pounds.  HEENT:  Normal.  NECK:  Without JVD.  LYMPH:  Without lymphadenopathy.  Carotids without bruits bilaterally.  CARDIAC:  Normal S1 and S2.  Regular rate and rhythm.  No murmur.  LUNGS:  Clear to auscultation bilaterally.  ABDOMEN:  Soft, nontender.  No organomegaly.  EXTREMITIES:  Without pitting edema.  NEUROLOGIC:  She is alert and oriented x3.  Cranial nerves II-XII  grossly intact.  SKIN:  Warm and dry.   Electrocardiogram revealed sinus bradycardia with heart rate of 56,  normal axis, poor wave progression,  T-wave inversions in V1 through V3,  interventricular conduction delay.   ASSESSMENT AND PLAN:  1. Chest pain.  Felicia Wright has several cardiac risk factors including      her age, diabetes mellitus, hypertension, dyslipidemia.  She also      is a prior smoker.  She had a fairly low-risk Myoview study in June      2008.  Her present symptoms are somewhat atypical.  Some of the      symptoms that she describes seem to be secondary to vasodilation      after exercise.  She does have T-wave inversions on her      electrocardiogram in the anterior leads that seem to be more      prominent than on prior tracings.  She has had no exertional      symptoms.  I have reviewed her case today with Dr. Molly Maduro.  Upon      further review, we have decided to proceed with dobutamine stress      echocardiogram.  The patient does not think she can walk on a      treadmill.  We will also get a baseline echocardiogram.  She will      return for close followup after testing.  2. Hypotension.  After further review with Dr. Dietrich Pates, we have      decided to take her off of Lasix, potassium, lisinopril, and      metoprolol for now.  We can reassess her blood pressure when she      returns in followup and add back any medicines that she may need.  3. Dyslipidemia.  This is followed by Dr. Erby Pian.  She is currently      on pravastatin and Zetia.  4. Diabetes mellitus.  With her weight loss, she has had to change      some of her diabetic medications as well.  I congratulated her on      his weight loss.   DISPOSITION:  Follow up with Dr. Dietrich Pates in the next couple of weeks  after stress test.      Tereso Newcomer, PA-C  Electronically Signed      Gerrit Friends. Dietrich Pates, MD, [facility]  Electronically Signed   SW/MedQ  DD: 01/09/2009  DT: 01/10/2009  Job #: [id number]   cc:   Franchot Heidelberg, M.D.

## 2011-03-22 NOTE — Letter (Signed)
February 11, 2009    Franchot Heidelberg, MD  [address]  [place], [place] [address]   RE:  LYNSEE, WANDS  MRN:  [id number]  /  DOB:  [birth year]   Dear Remi Haggard:   Ms. Alen returns to the office for continued assessment and treatment  of chest discomfort and hypertension with a recent episode of  symptomatic hypotension.  She has done well since her last visit.  She  has had no dizziness and certainly no syncope.  She has experienced no  chest or neck discomfort.  She is feeling quite well except for her  chronic right knee pain.  This in turn is fairly well controlled with  Tylenol and Naprosyn.   She has monitored blood pressures at home.  These were initially on the  low side, but have gradually increased to systolics of 130 - 150.  No  diastolics above 90 have been recorded.   CURRENT MEDICATIONS:  1. Aspirin 81 mg daily.  2. Omeprazole 20 mg daily.  3. Ezetimibe 10 mg daily.  4. Prozac 20 mg daily.  5. Pravastatin 80 mg daily.  6. Tylenol and Naprosyn p.r.n.   PHYSICAL EXAMINATION:  GENERAL:  Very pleasant overweight woman in no  acute distress.  VITAL SIGNS:  The weight is 278, 6 pounds more than at her last visit.  Blood pressure 145/75, heart rate 56 and regular, respirations 12 and  unlabored.  NECK:  No jugular venous distention; no carotid bruits.  CARDIAC:  Normal intensity of first and second heart sounds; first heart  sound is split.  ABDOMEN:  Soft and nontender; no organomegaly; no bruits.  LUNGS:  Clear.  EXTREMITIES:  No edema.   LABORATORY DATA:  Laboratory from March 31 shows a normal chemistry  profile.   Stress echocardiogram was entirely unremarkable with neither  electrocardiographic nor echocardiographic evidence for ischemia or  infarction under a pharmacologic stress protocol.  Her echocardiogram  likewise was normal.  There was mild left ventricular dilatation.   IMPRESSION:  Ms. Grondahl is doing extremely well overall.  There is  no  evidence for coronary artery disease.  She wonders whether she is a  candidate for total knee arthroplasty.  Other than her obesity, she does  not have significant risk factors for perioperative complications.   Blood pressure is gradually increasing off antihypertensive medications.  With a history of diabetes, she would benefit from blood pressures  closer to 130/80.  Lisinopril will be resumed at a dose of 20 mg daily.  She will return to see you for further management of cardiovascular risk  factors.  Please call at any time that I can assist in the care of this  nice woman.    Sincerely,      Gerrit Friends. Dietrich Pates, MD, [facility]  Electronically Signed    RMR/MedQ  DD: 02/11/2009  DT: 02/12/2009  Job #: [id number]

## 2011-03-22 NOTE — Letter (Signed)
June 11, 2007    Franchot Heidelberg, M.D.  [address]  [place], [place] [address]   RE:  RAYANN, Felicia Wright  MRN:  [id number]  /  DOB:  10/31/1951   Dear Remi Haggard:   [name] returns to the office for continued assessment and treatment of  presumed angina with multiple cardiovascular risk factors.  Since her  last visit, she has done much better.  She had only one episode of chest  discomfort that responded promptly to rest.  She has not tried  nitroglycerin.  She did undergo stress testing.  This showed a small  septal abnormality with questionable redistribution.  It was unclear  whether this represented breast attenuation or a true perfusion defect.   Medications are unchanged from her last visit except the addition of low-  dose metoprolol.   On exam, a pleasant, overweight, woman in no acute distress.  The weight  is 335, six pounds more than at her last visit.  Blood pressure 120/65,  heart rate 55 and regular, respirations 18.  NECK:  No jugular venous distention; normal carotid upstrokes without  bruits.  LUNGS:  Decreased breath sounds in the bases.  CARDIAC:  Normal first and second heart sounds.  EXTREMITIES:  No edema.   IMPRESSION:  [name] is dramatically improved with low-dose metoprolol.  Due to bradycardia, we cannot increase this medication.  She can take  amlodipine, if this proves necessary.  We will defer that decision for  the time being.   Her stress test is a low risk study.  While she certainly could have  coronary disease, there is no absolute necessity for cardiac  catheterization, which she does not desire.  We will continue with  medical therapy for the time being.   Her LDL cholesterol was 117.  She hopes to reduce this by dietary  measures, but that is unlikely.  I suggested that she increase  Pravastatin to 80 mg daily.  If she experiences recurrent muscle  symptoms, she can resume 40 mg daily, and we can consider other  measures, such  as a resin.  I will plan to see this nice woman again in  2 months.    Sincerely,      Gerrit Friends. Dietrich Pates, MD, [facility]  Electronically Signed    RMR/MedQ  DD: 06/11/2007  DT: 06/11/2007  Job #: [id number]

## 2011-03-22 NOTE — Procedures (Signed)
Felicia Wright, Felicia Wright              ACCOUNT NO.:  [id number]   MEDICAL RECORD NO.:  [id number]          PATIENT TYPE:  OUT   LOCATION:  RAD                           FACILITY:  [facility]   PHYSICIAN:  Gerrit Friends. Dietrich Pates, MD, FACCDATE OF BIRTH:  Dec 01, 1950   DATE OF PROCEDURE:  DATE OF DISCHARGE:  01/19/2009                                ECHOCARDIOGRAM   CLINICAL DATA:  A 60 year old woman with chest pain, fatigue and  multiple cardiovascular risk factors.  1. Progressive dobutamine infusion resulted in a heart rate of 151,      93% of age-predicted maximum.  The headache in palpitations were      reported by the patient.  Modest increase in PVCs and PACs from      baseline was noted.  2. Blood pressure increased from a baseline value of 140/80-170/80 at      peak drug infusion.  3. Baseline EKG:  Sinus bradycardia; somewhat delayed R-wave      progression; otherwise unremarkable.  4. Stress EKG:  No significant change.  5. Baseline echocardiogram:  Normal left atrial size; normal right      ventricular size and function; normal aortic and mitral valve;      normal left ventricular size and function; no LVH.  6. Post-exercise images:  Increased contractility and decreased left      ventricular volume at both low-dose dobutamine infusion and at peak      heart rate.   IMPRESSION:  Negative pharmacologic stress echocardiogram revealing  neither electrocardiographic nor echocardiographic evidence for ischemia  or infarction.  Other findings are as noted.      Gerrit Friends. Dietrich Pates, MD, [facility]  Electronically Signed     RMR/MEDQ  D:  01/22/2009  T:  01/22/2009  Job:  [id number]

## 2011-03-22 NOTE — Letter (Signed)
September 17, 2007    Franchot Heidelberg, M.D.  [address], [address]  [place], [place]  [address]   RE:  RUPERT [facility] Wright, Felicia Wright  MRN:  [id number]  /  DOB:  May 29, 1951   Dear Felicia Wright:   Felicia Wright returns to the office for continued assessment and treatment of  possible angina and cardiovascular risk factors.  Since her last visit,  she has done superbly.  She reports no palpitations or chest discomfort.  Diabetic control has been good with fasting glucose 120 at its peak.  Blood pressure control has been good.  She is tolerating a higher dose  of pravastatin.  Otherwise, medications are unchanged from her last  visit.  She reports oral ulceration that her dentist is managing.   EXAM:  Very pleasant woman in no acute distress.  The weight is 327, 8 pounds less than at her last visit.  Blood pressure  is 110/68, heart rate 64 and regular, respirations 16.  NECK:  No jugular venous distension; normal carotid upstrokes without  bruits.  LUNGS:  Clear.  CARDIAC:  Normal first and second heart sounds.  ABDOMEN:  Soft and nontender; few bowel sounds.  EXTREMITIES:  No edema.   IMPRESSION:  Felicia Wright is doing quite well.  All of her medical issues  are controlled with current medications, except perhaps hyperlipidemia.  We will check her repeat lipid profile and chemistry profile.  I will  plan to see this nice woman again in 8 months.    Sincerely,      Gerrit Friends. Dietrich Pates, MD, [facility]  Electronically Signed    RMR/MedQ  DD: 09/17/2007  DT: 09/17/2007  Job #: [id number]

## 2011-03-22 NOTE — Letter (Signed)
April 20, 2007    Franchot Heidelberg, M.D.  [address], [address]  [place], [place]  [address]   RE:  Felicia Wright, Felicia Wright  MRN:  [id number]  /  DOB:  December 16, 1950   Dear Remi Haggard:   It was my pleasure evaluating Ms. Felicia Wright in the office today in  consultation at your request.  As you know, this nice woman has a long  history of obesity and obesity-related cardiovascular risk factors.  Remarkably, she has lost 65 pounds in recent months and is doing quite a  bit better in that regard.  She has had hypertension that has been well-  controlled with a single medication.  Likewise, she has had mild  diabetes that has been controlled with minimal oral medication and  weight loss.  She has dyslipidemia with a recent increase in the  intensity of her medical therapy.   When she was heavier, she could barely walk at all.  As she has lost  weight, she has been able to cover a substantial amount of ground.  Over  the last week or two she has noted exertional chest heaviness that  resolves after 10 minutes of rest.  The intensity is mild to moderate.  There is no associated dyspnea.  There is no radiation.  There is no  diaphoresis nor nausea.  She has had no resting symptoms.  You have  provided her with sublingual nitroglycerin, which she has not yet used.   PAST MEDICAL HISTORY:  Notable for multiple surgical procedures  including:  1. A sinus operation in 1983.  2. A laparoscopic cholecystectomy in 1996.  3. A remote tonsillectomy.  4. Remote hemorrhoidectomy.  5. An orthopedic procedure on her right foot in 1993.  6. Carpal tunnel release in 1997 on the right.   MEDICAL CONDITIONS:  1. Allergic rhinitis.  2. Mild asthma.  3. Depression.  4. GERD.  5. A remote history of seizure.  6. Depression and sleep apnea for which she uses a positive pressure      device.   AN ALLERGY TO ZOLOFT IS REPORTED.   CURRENT MEDICATIONS:  1. Metformin 500 mg daily.  2. Kay-Ciel 40 mEq daily.  3.  Furosemide 80 mg daily.  4. Aspirin 81 mg daily.  5. Lisinopril 40 mg daily.  6. Omeprazole 20 mg daily.  7. Naproxen 500 mg b.i.d.  8. Pravastatin 40 mg daily.  9. Ezetimibe 10 mg daily.  10.Prozac 20 mg daily.  11.Nystatin swish and swallow for thrush.   SOCIAL HISTORY:  Disabled for the last 12 years; performs sedentary  activities like reading and sewing.  Separated with no children.  Remote  history of modest cigarette smoking; no excessive alcohol.   FAMILY HISTORY:  Father is unknown to her.  Her mother died due to  congestive heart failure.  Grandparents had strokes at an advanced age  and myocardial infarction likewise.  She has a brother with hypertension  and diabetes.   REVIEW OF SYSTEMS:  Notable for occasional headache, mild orthostatic  dizziness, the need for corrective lenses, glaucoma, upper and lower  dentures, Class II dyspnea on exertion, occasional constipation, history  of fibroids, menopause approximately 1-2 years ago, DJD of the knees and  spine.  All other systems reviewed and are negative.   EXAMINATION:  Overweight pleasant woman in no acute distress who weighed  in at 329.  Blood pressure 105/75, heart rate 75 and regular,  respirations 16.  NECK:  No jugular venous  distention; normal carotid upstrokes without  bruits.  ENDOCRINE:  No thyromegaly.  HEMATOPOIETIC:  No adenopathy.  SKIN:  No significant lesions.  LUNGS:  Clear.  CARDIAC:  Grade 1-2/6 systolic ejection murmur at the cardiac base.  ABDOMEN:  Soft and nontender; no masses, no organomegaly.  EXTREMITIES:  Trace edema; decreased left dorsalis pedis pulse.  NEUROMUSCULAR:  Symmetric strength and tone; normal cranial nerves.  PSYCHIATRIC:  Alert and oriented, normal affect.   IMPRESSION:  Felicia Wright presents with stable exertional angina.  She has  significant cardiovascular risk, and the likelihood of coronary disease  is certainly greater than 50%.  Nonetheless she is not eager to  undergo  a cardiac catheterization.  She was hoping that her size would  disqualify her, but we have equipment that could accommodate someone of  her weight.  Nonetheless, she would prefer to start with a stress test.  If the results were lower risk and medical therapy is effective we do  not necessarily need to proceed with invasive testing.  I will add  metoprolol 25 mg b.i.d. to her medical regimen and plan to see this nice  woman after her test has been performed.  Thank so much for sending her  for evaluation.    Sincerely,      Gerrit Friends. Dietrich Pates, MD, [facility]  Electronically Signed    RMR/MedQ  DD: 04/20/2007  DT: 04/21/2007  Job #: [id number]

## 2011-03-22 NOTE — Op Note (Signed)
Felicia Wright, Felicia Wright              ACCOUNT NO.:  [id number]   MEDICAL RECORD NO.:  [id number]          PATIENT TYPE:  OIB   LOCATION:  5030                         FACILITY:  [facility]   PHYSICIAN:  Rodney A. Mortenson, M.D.DATE OF BIRTH:  05/17/1951   DATE OF PROCEDURE:  06/23/2009  DATE OF DISCHARGE:                               OPERATIVE REPORT   JUSTIFICATION:  A 60 year old female with over 1 year history of pain in  right knee.  She has episodes of locking in the knee, feels it is going  out of place.  She had been treated conservatively for an extended  period of time and continues to have pain and discomfort.  She has bone-  on-bone articulation of lateral patellofemoral compartment and a lateral  patellar facet that was banging against lateral femoral condyle.  There  is acute tenderness along medial joint line.  She has failed all  conservative care and an MRI shows significant tricompartmental changes  and tear of the posterior horn of medial meniscus, loose bodies, and a  large lateral overhanging of the lateral patellar facet, which is  causing significant impingement and pain.  She is now admitted for  arthroscopic evaluation and treatment.  Complication discussed  preoperatively.  Questions answered and encouraged.  She clearly  understands this will  not solve all the problems, but hopefully, we  will make her knee less painful and she wishes to proceed.   JUSTIFICATION FOR OUTPATIENT SURGERY:  Minimal morbidity.   PREOPERATIVE DIAGNOSES:  Torn posterior horn, medial meniscus, right  knee; impingement, lateral patellar facet with osteoarthritis.   POSTOPERATIVE DIAGNOSES:  Torn posterior horn, medial meniscus; torn  posterior horn, lateral meniscus; lateral patellar facet impingement  with large lateral patellar process and loose bodies, left knee;  osteoarthritis, left knee.   OPERATION:  Arthroscopy; debride posterior horn, medial and lateral  meniscus; lateral  patellar release; partial lateral facetectomy, right  knee.   SURGEON:  Lenard Galloway. Chaney Malling, MD   ASSISTANT:  Oris Drone. Petrarca, PA-C   ANESTHESIA:  General.   DESCRIPTION OF PROCEDURE:  The patient was placed on the operative table  in the supine position with pneumatic tourniquet about the right upper  thigh.  The entire right lower extremity was placed in a leg holder and  then prepped out with DuraPrep and draped out in the usual manner.  An  infusion cannula was placed in the superior medial pouch.  Anteromedial  and anterolateral portals were made, and the arthroscope was introduced.  The arthroscope was first placed in the lateral compartment and there  was tearing and fraying of articular cartilage over the tibial plateau  and a tear of the posterior horn of the lateral meniscus, which was not  seen on the MRI.  Both medial and lateral portals were made.  Through  both portals, a series of baskets were inserted, and the posterior horn  of the lateral meniscus was debrided.  This was followed up with the  intra-articular shaver and this was smoothed and balanced, and loose  bodies were removed.  The arthroscope was then placed  in the medial  compartment.  The anterior two-thirds of the medial meniscus appeared  normal, but there was a small tear of the posterior horn.  This also was  debrided with a series of baskets followed by the intra-articular  shaver.  The patellofemoral junction was then visualized.  The lateral  half of lateral femoral condyle showed no articular cartilage and there  was a huge process hanging over the edge of the lateral femoral condyle  and it was banging up against nonarticular portion of the lateral  femoral condyle and anteriorly.  This was causing significant problems.  The arthroscope was then removed.   An incision was made over the anterior aspect of the knee at the margin  of the lateral edge of the patella.  Dissection carried down to the   lateral patella and this was opened.  The periosteum was elevated, and  this large lateral process was actually free, and there was a large  piece of bone probably 3 cm in length to 1.5 cm in width, which had no  articular cartilage and it was banging against the lateral femoral  condyle, and caused significant impingement.  This whole piece was  removed, and a power saw was then used to take out a little more of the  lateral patellar facet.  Once this was accomplished, patella now tracked  in the midline and decompressed the area of raw bone over the lateral  facet.  Part of the wound was closed with Vicryl and subcutaneous tissue  closed with Vicryl, and skin was closed with stainless steel staples.  Sterile dressings were applied, knee immobilizer applied, and the  patient returned to recovery room in excellent condition.  Technically,  this procedure went extremely well.   DISPOSITION:  1. The patient is going to stay overnight since she does have sleep      apnea for observation.  2. Discharge in a.m.  3. Return to my office next week.  4. Usual postop instructions were given.      Rodney A. Chaney Malling, M.D.  Electronically Signed     RAM/MEDQ  D:  06/23/2009  T:  06/24/2009  Job:  [id number]

## 2011-03-25 NOTE — H&P (Signed)
[facility]  Patient:    Felicia Wright, Felicia Wright Visit Number: [id number] MRN: [id number]          Service Type: Attending:  Marina Gravel, M.D. Dictated by:   Marina Gravel, M.D.                           History and Physical  SOCIAL SECURITY NUMBER:       [id number]  DATE OF BIRTH:                [birth year].  SCHEDULED PROCEDURE DATE:     September 07, 2001.  PREOPERATIVE DIAGNOSES:       1. Postmenopausal bleeding.                               2. Morbid obesity.  HISTORY OF PRESENT ILLNESS:   This is a 60 year old African-American female, gravida 2, para 0, A2, who was seen at the request of Dr. Loleta Chance for evaluation of menometrorrhagia for about nine months. She had a pelvic ultrasound which was limited due to her obesity and was unclear regarding possibility of a thickened endometrium. She has had crampy lower abdominal pain which at times is severe and associated with blood clots. She has been using Prometrium 100 mg daily ten days a month which has not decreased the blood flow.  The patient has been evaluated in the office with attempts at endometrial biopsy, however, this was impossible as the cervix could not be visualized. Subsequent pelvic ultrasound in our office showed a large fundal fibroid and a thickened endometrium at 1.1 cm. Ovaries could not be well visualized due to patient having an extremely long vagina. Given the inadequate evaluation and postmenopausal bleeding, the patient presents for hysteroscopy/D&C for definitive diagnosis. The patient has been cleared for surgery by her pulmonologist, Dr. Maple Hudson, at [facility].  PAST MEDICAL HISTORY:         Morbid obesity, obstructive sleep apnea. Plans are to have CPAP in the recovery room. Other medical history includes congestive heart failure. By patients report, this is due to obesity. 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  PAST  SURGICAL HISTORY:        Tonsillectomy, hemorrhoidectomy, sinus surgery, carpal tunnel surgery, and cholecystectomy.  MEDICATIONS:                  Lescol, Prozac, Lasix, Prometrium, and K-Dur.  ALLERGIES:                    ZOLOFT.  SOCIAL HISTORY:               The patient does not smoke, occasional alcohol use, and uses no other drugs.  FAMILY HISTORY:               Noncontributory.  REVIEW OF SYSTEMS:            CONSTITUTIONAL: No unexplained weight change, fever, dizzy spells, or fainting. EYES: No trouble with vision. ENT: Patient has a history of nose and sinus problems. CARDIOVASCULAR: History of CHF and irregular heart beat. RESPIRATORY: Shortness of breath and sleep apnea. GASTROINTESTINAL: History of nausea, vomiting, and heartburn. GENITOURINARY: See HPI. MUSCULOSKELETAL: History of DJD and joint pain. SKIN/BREASTS: No lesions. NEUROLOGICAL: No headaches, dizzy spells, or balance problems. PSYCHIATRIC: No work or family problems, domestic violence, or  history of sexual assault. ENDOCRINE: Hot flashes. HEMATOLOGIC: No unexplained bruising or bleeding from gums.  PHYSICAL EXAMINATION:  VITAL SIGNS:                  Blood pressure 130/84, weight 400 pounds plus. Height 5 feet 5 inches.  GENERAL:                      The patient is morbidly obese. No deformities. Well groomed.  HEART:                        Regular rate and rhythm.  LUNGS:                        Clear to auscultation, however, heart sounds and lung sounds are distant due to her obesity.  ABDOMEN:                      Exam in limited by obesity but no hernia noted. Liver and spleen normal.  LYMPH NODE SURVEY:            Negative to groin.  SKIN:                         No lesions.  PELVIC:                       Normal external female genitalia. Vagina normal, however, very long with redundant sidewalls which limits visualization of the cervix. Uterus is not palpated due to body habitus and neither  are the adnexa. The urethral meatus, urethra, bladder base, anus, perineum, and rectal are all within normal limits.  ASSESSMENT:                   1. Postmenopausal bleeding with inadequate                                  evaluation in the office due to morbid                                  obesity.                               2. Thickened endometrial stripe on ultrasound.  PLAN:                         The patient presents for definitive diagnosis with hysteroscopy/D&C. Operative risks discussed including infection, bleeding, uterine perforation, damage to surrounding organs, and potential increase morbidity due to her morbid obesity, sleep apnea, and congestive heart failure. The patient has been cleared for surgery by her pulmonologist and plans are to have CPAP in the recovery room to reduce chances of difficulty with history of sleep apnea. Arrangements have been made for surgery on September 07, 2001 at [facility]. Dictated by:   Marina Gravel, M.D. Attending:  Marina Gravel, M.D. DD:  09/05/01 TD:  09/05/01 Job: [id number]

## 2011-11-02 ENCOUNTER — Encounter: Payer: Self-pay | Admitting: Cardiology

## 2011-11-02 DIAGNOSIS — F329 Major depressive disorder, single episode, unspecified: Secondary | ICD-10-CM | POA: Insufficient documentation

## 2011-11-02 DIAGNOSIS — F32A Depression, unspecified: Secondary | ICD-10-CM | POA: Insufficient documentation

## 2011-12-22 ENCOUNTER — Other Ambulatory Visit (HOSPITAL_COMMUNITY): Payer: Self-pay | Admitting: Physical Medicine and Rehabilitation

## 2011-12-22 DIAGNOSIS — M5412 Radiculopathy, cervical region: Secondary | ICD-10-CM

## 2011-12-22 DIAGNOSIS — M79609 Pain in unspecified limb: Secondary | ICD-10-CM

## 2011-12-22 DIAGNOSIS — M5030 Other cervical disc degeneration, unspecified cervical region: Secondary | ICD-10-CM

## 2011-12-26 ENCOUNTER — Ambulatory Visit (HOSPITAL_COMMUNITY): Admission: RE | Admit: 2011-12-26 | Payer: Medicare Other | Source: Ambulatory Visit

## 2011-12-29 ENCOUNTER — Ambulatory Visit (HOSPITAL_COMMUNITY)
Admission: RE | Admit: 2011-12-29 | Discharge: 2011-12-29 | Disposition: A | Discharge status: Home / Self Care | Payer: Medicare Other | Source: Ambulatory Visit | Attending: Physical Medicine and Rehabilitation | Admitting: Physical Medicine and Rehabilitation

## 2011-12-29 DIAGNOSIS — M5412 Radiculopathy, cervical region: Secondary | ICD-10-CM

## 2012-01-02 ENCOUNTER — Other Ambulatory Visit: Payer: Self-pay | Admitting: Physical Medicine and Rehabilitation

## 2012-01-02 ENCOUNTER — Other Ambulatory Visit (HOSPITAL_COMMUNITY): Payer: Self-pay | Admitting: Physical Medicine and Rehabilitation

## 2012-01-02 DIAGNOSIS — M719 Bursopathy, unspecified: Secondary | ICD-10-CM

## 2012-01-02 DIAGNOSIS — M79609 Pain in unspecified limb: Secondary | ICD-10-CM

## 2012-01-02 DIAGNOSIS — G894 Chronic pain syndrome: Secondary | ICD-10-CM

## 2012-01-02 DIAGNOSIS — M67919 Unspecified disorder of synovium and tendon, unspecified shoulder: Secondary | ICD-10-CM

## 2012-01-02 DIAGNOSIS — M5030 Other cervical disc degeneration, unspecified cervical region: Secondary | ICD-10-CM

## 2012-01-02 DIAGNOSIS — M5412 Radiculopathy, cervical region: Secondary | ICD-10-CM

## 2012-01-04 ENCOUNTER — Other Ambulatory Visit (HOSPITAL_COMMUNITY): Payer: Medicare Other

## 2012-01-07 ENCOUNTER — Ambulatory Visit
Admission: RE | Admit: 2012-01-07 | Discharge: 2012-01-07 | Disposition: A | Discharge status: Home / Self Care | Payer: Medicare Other | Source: Ambulatory Visit | Attending: Physical Medicine and Rehabilitation | Admitting: Physical Medicine and Rehabilitation

## 2012-01-07 ENCOUNTER — Ambulatory Visit: Admission: RE | Admit: 2012-01-07 | Payer: Medicare Other | Source: Ambulatory Visit

## 2012-01-07 ENCOUNTER — Other Ambulatory Visit: Payer: Self-pay | Admitting: Physical Medicine and Rehabilitation

## 2012-01-07 DIAGNOSIS — M5090 Cervical disc disorder, unspecified, unspecified cervical region: Secondary | ICD-10-CM

## 2012-01-07 DIAGNOSIS — M5412 Radiculopathy, cervical region: Secondary | ICD-10-CM

## 2012-01-07 DIAGNOSIS — M5030 Other cervical disc degeneration, unspecified cervical region: Secondary | ICD-10-CM

## 2012-01-07 DIAGNOSIS — M79609 Pain in unspecified limb: Secondary | ICD-10-CM

## 2012-01-07 DIAGNOSIS — M67919 Unspecified disorder of synovium and tendon, unspecified shoulder: Secondary | ICD-10-CM

## 2012-01-07 DIAGNOSIS — G894 Chronic pain syndrome: Secondary | ICD-10-CM

## 2012-01-07 DIAGNOSIS — M719 Bursopathy, unspecified: Secondary | ICD-10-CM

## 2012-01-23 ENCOUNTER — Other Ambulatory Visit (HOSPITAL_COMMUNITY): Payer: Self-pay | Admitting: Family Medicine

## 2012-01-23 DIAGNOSIS — Z139 Encounter for screening, unspecified: Secondary | ICD-10-CM

## 2012-01-27 ENCOUNTER — Ambulatory Visit (HOSPITAL_COMMUNITY)
Admission: RE | Admit: 2012-01-27 | Discharge: 2012-01-27 | Disposition: A | Discharge status: Home / Self Care | Payer: Medicare Other | Source: Ambulatory Visit | Attending: Family Medicine | Admitting: Family Medicine

## 2012-01-27 DIAGNOSIS — Z139 Encounter for screening, unspecified: Secondary | ICD-10-CM

## 2012-01-27 DIAGNOSIS — R928 Other abnormal and inconclusive findings on diagnostic imaging of breast: Secondary | ICD-10-CM | POA: Insufficient documentation

## 2012-01-27 DIAGNOSIS — Z1231 Encounter for screening mammogram for malignant neoplasm of breast: Secondary | ICD-10-CM | POA: Insufficient documentation

## 2012-02-01 ENCOUNTER — Other Ambulatory Visit: Payer: Self-pay | Admitting: Family Medicine

## 2012-02-01 DIAGNOSIS — R928 Other abnormal and inconclusive findings on diagnostic imaging of breast: Secondary | ICD-10-CM

## 2012-02-10 ENCOUNTER — Ambulatory Visit
Admission: RE | Admit: 2012-02-10 | Discharge: 2012-02-10 | Disposition: A | Discharge status: Home / Self Care | Payer: Medicare Other | Source: Ambulatory Visit | Attending: Family Medicine | Admitting: Family Medicine

## 2012-02-10 DIAGNOSIS — R928 Other abnormal and inconclusive findings on diagnostic imaging of breast: Secondary | ICD-10-CM

## 2012-02-16 ENCOUNTER — Encounter (INDEPENDENT_AMBULATORY_CARE_PROVIDER_SITE_OTHER): Payer: Self-pay | Admitting: Surgery

## 2012-02-16 ENCOUNTER — Ambulatory Visit (INDEPENDENT_AMBULATORY_CARE_PROVIDER_SITE_OTHER): Payer: Medicare Other | Admitting: Surgery

## 2012-02-16 ENCOUNTER — Other Ambulatory Visit (INDEPENDENT_AMBULATORY_CARE_PROVIDER_SITE_OTHER): Payer: Self-pay | Admitting: Surgery

## 2012-02-16 DIAGNOSIS — R928 Other abnormal and inconclusive findings on diagnostic imaging of breast: Secondary | ICD-10-CM

## 2012-02-16 DIAGNOSIS — R921 Mammographic calcification found on diagnostic imaging of breast: Secondary | ICD-10-CM | POA: Insufficient documentation

## 2012-02-16 NOTE — Progress Notes (Signed)
Patient ID: Felicia Wright, female   DOB: Dec 04, 1950, 61 y.o.   MRN: [id number]  Chief Complaint  Patient presents with  . Other    abnormal calcifications right breast    HPI Felicia Wright is a 61 y.o. female.   HPI She is referred by Dr. Guinevere Ferrari for evaluation of abnormal calcifications in the right breast found on recent mammography. Stereotactic biopsy could not be performed as she was too heavy for the biopsy table. She has no previous problems with her breasts. She has had no previous biopsies. She denies nipple discharge. Family history is negative for breast cancer Past Medical History  Diagnosis Date  . Normocytic anemia   . Malaise and fatigue   . Oral candidiasis   . Contusion of second toe, right   . Cystitis   . Accidental fall   . Aphthous ulcer   . Hip pain, left   . Preventative health care   . Encounter for long-term (current) use of other medications   . Ulnar neuropathy     Left  . Reactive depression (situational)   . Morbid obesity   . Diabetes mellitus type II   . Sleep apnea   . Fibroid uterus   . Carpal tunnel syndrome   . Cataract   . Arthritis   . Overactive bladder   . Incontinence   . Constipation   . IBS (irritable bowel syndrome)   . Bronchitis   . Abnormal heart rhythm   . Seizure disorder   . Hypertension   . Hyperlipidemia   . GERD (gastroesophageal reflux disease)   . Depression   . Asthma   . Allergic rhinitis     Past Surgical History  Procedure Date  . Nasal sinus surgery 1983  . Tonsillectomy   . Hemorroidectomy   . Heel spur surgery     Right  . Carpal tunnel release     Right  . Cholecystectomy   . Knee arthroscopy     Right    Family History  Problem Relation Age of Onset  . Hypertension Mother   . Heart failure Mother   . Fibroids Sister   . Hypertension Brother   . Diabetes Brother   . Arthritis Other   . Coronary artery disease Other   . Asthma Other     Social History History  Substance Use  Topics  . Smoking status: Former Games developer  . Smokeless tobacco: Not on file  . Alcohol Use: No    Allergies  Allergen Reactions  . Sertraline Hcl     REACTION: Lip swelling and loose stools    Current Outpatient Prescriptions  Medication Sig Dispense Refill  . acetaminophen (TYLENOL) 500 MG tablet Take 1,000 mg by mouth at bedtime.        Marland Kitchen aspirin 81 MG tablet Take 81 mg by mouth daily.        Marland Kitchen ezetimibe (ZETIA) 10 MG tablet Take 10 mg by mouth daily.        Marland Kitchen FLUoxetine (PROZAC) 20 MG capsule Take 20 mg by mouth at bedtime.        Marland Kitchen lisinopril (PRINIVIL,ZESTRIL) 20 MG tablet Take 20 mg by mouth daily.        . Multiple Vitamin (MULTIVITAMIN) tablet Take 1 tablet by mouth daily.        Marland Kitchen omeprazole (PRILOSEC) 20 MG capsule Take 20 mg by mouth daily.        . pravastatin (PRAVACHOL) 80 MG tablet Take 80 mg  by mouth daily.          Review of Systems Review of Systems  Constitutional: Negative for fever, chills and unexpected weight change.  HENT: Negative for hearing loss, congestion, sore throat, trouble swallowing and voice change.   Eyes: Negative for visual disturbance.  Respiratory: Positive for apnea. Negative for cough and wheezing.   Cardiovascular: Positive for leg swelling. Negative for chest pain and palpitations.  Gastrointestinal: Negative for nausea, vomiting, abdominal pain, diarrhea, constipation, blood in stool, abdominal distention and anal bleeding.  Genitourinary: Negative for hematuria, vaginal bleeding and difficulty urinating.  Musculoskeletal: Positive for back pain and joint swelling. Negative for arthralgias.  Skin: Negative for rash and wound.  Neurological: Negative for seizures, syncope and headaches.  Hematological: Negative for adenopathy. Does not bruise/bleed easily.  Psychiatric/Behavioral: Negative for confusion.    Blood pressure 123/77, pulse 84, temperature 98.6 F (37 C), temperature source Temporal, resp. rate 18, height 5\' 5"  (1.651 m),  weight 306 lb 12.8 oz (139.164 kg).  Physical Exam Physical Exam  Constitutional: She is oriented to person, place, and time. She appears well-nourished. No distress.       Morbidly obese female in no distress  HENT:  Head: Normocephalic and atraumatic.  Right Ear: External ear normal.  Left Ear: External ear normal.  Nose: Nose normal.  Mouth/Throat: Oropharynx is clear and moist.  Eyes: Conjunctivae and EOM are normal. Pupils are equal, round, and reactive to light. No scleral icterus.  Neck: Normal range of motion. Neck supple. No tracheal deviation present. No thyromegaly present.  Cardiovascular: Normal rate, regular rhythm and intact distal pulses.   Murmur heard. Pulmonary/Chest: Effort normal and breath sounds normal. No respiratory distress. She has no wheezes.  Abdominal: Soft. Bowel sounds are normal. She exhibits no distension. There is no tenderness.  Musculoskeletal: Normal range of motion. She exhibits edema and tenderness.  Lymphadenopathy:    She has no cervical adenopathy.    She has no axillary adenopathy.  Neurological: She is alert and oriented to person, place, and time.  Skin: Skin is warm and dry. No rash noted. No erythema.  Psychiatric: Her behavior is normal. Judgment normal.  Breast: Her breasts were quite large. There are no palpable masses in either breast. Areola are normal. There is no axillary adenopathy on either side  Data Reviewed Patient's mammogram demonstrates an area of pleomorphic microcalcifications medially in the right breast in an area slightly larger than 3 cm  Assessment    Abnormal microcalcifications of the right breast    Plan    As she cannot undergo stereotactic biopsy, needle localized excision of these calcifications is recommended for histologic evaluation to rule out malignancy. I discussed this with her in detail. I discussed the risks of surgery which includes but is not limited to bleeding, infection, need for further  surgery should malignancy be found, cardiopulmonary issues, et Karie Soda. She understands and wishes to proceed. Surgery will be scheduled. Likelihood of success is good       Sidra Oldfield A 02/16/2012, 2:30 PM

## 2012-03-02 ENCOUNTER — Encounter (HOSPITAL_BASED_OUTPATIENT_CLINIC_OR_DEPARTMENT_OTHER): Payer: Self-pay | Admitting: *Deleted

## 2012-03-02 NOTE — Progress Notes (Signed)
To come in for ekg and bmet-bring cpap dos-knows to wear dos-

## 2012-03-05 ENCOUNTER — Encounter (HOSPITAL_BASED_OUTPATIENT_CLINIC_OR_DEPARTMENT_OTHER)
Admission: RE | Admit: 2012-03-05 | Discharge: 2012-03-05 | Disposition: A | Discharge status: Home / Self Care | Payer: Medicare Other | Source: Ambulatory Visit | Attending: Surgery | Admitting: Surgery

## 2012-03-05 LAB — BASIC METABOLIC PANEL
CO2: 26 mEq/L (ref 19–32)
Calcium: 10 mg/dL (ref 8.4–10.5)
Chloride: 104 mEq/L (ref 96–112)
Glucose, Bld: 87 mg/dL (ref 70–99)
Sodium: 142 mEq/L (ref 135–145)

## 2012-03-06 NOTE — H&P (Signed)
Shelly Rubenstein, MD 02/16/2012 2:48 PM Signed  Patient ID: Felicia Wright, female DOB: Jul 29, 1951, 61 y.o. MRN: [id number]  Chief Complaint   Patient presents with   .  Other     abnormal calcifications right breast    HPI  Felicia Wright is a 61 y.o. female.  HPI  She is referred by Dr. Guinevere Ferrari for evaluation of abnormal calcifications in the right breast found on recent mammography. Stereotactic biopsy could not be performed as she was too heavy for the biopsy table. She has no previous problems with her breasts. She has had no previous biopsies. She denies nipple discharge. Family history is negative for breast cancer  Past Medical History   Diagnosis  Date   .  Normocytic anemia    .  Malaise and fatigue    .  Oral candidiasis    .  Contusion of second toe, right    .  Cystitis    .  Accidental fall    .  Aphthous ulcer    .  Hip pain, left    .  Preventative health care    .  Encounter for long-term (current) use of other medications    .  Ulnar neuropathy      Left   .  Reactive depression (situational)    .  Morbid obesity    .  Diabetes mellitus type II    .  Sleep apnea    .  Fibroid uterus    .  Carpal tunnel syndrome    .  Cataract    .  Arthritis    .  Overactive bladder    .  Incontinence    .  Constipation    .  IBS (irritable bowel syndrome)    .  Bronchitis    .  Abnormal heart rhythm    .  Seizure disorder    .  Hypertension    .  Hyperlipidemia    .  GERD (gastroesophageal reflux disease)    .  Depression    .  Asthma    .  Allergic rhinitis     Past Surgical History   Procedure  Date   .  Nasal sinus surgery  1983   .  Tonsillectomy    .  Hemorroidectomy    .  Heel spur surgery      Right   .  Carpal tunnel release      Right   .  Cholecystectomy    .  Knee arthroscopy      Right    Family History   Problem  Relation  Age of Onset   .  Hypertension  Mother    .  Heart failure  Mother    .  Fibroids  Sister    .  Hypertension   Brother    .  Diabetes  Brother    .  Arthritis  Other    .  Coronary artery disease  Other    .  Asthma  Other     Social History  History   Substance Use Topics   .  Smoking status:  Former Games developer   .  Smokeless tobacco:  Not on file   .  Alcohol Use:  No    Allergies   Allergen  Reactions   .  Sertraline Hcl      REACTION: Lip swelling and loose stools    Current Outpatient Prescriptions   Medication  Sig  Dispense  Refill   .  acetaminophen (TYLENOL) 500 MG tablet  Take 1,000 mg by mouth at bedtime.     Marland Kitchen  aspirin 81 MG tablet  Take 81 mg by mouth daily.     Marland Kitchen  ezetimibe (ZETIA) 10 MG tablet  Take 10 mg by mouth daily.     Marland Kitchen  FLUoxetine (PROZAC) 20 MG capsule  Take 20 mg by mouth at bedtime.     Marland Kitchen  lisinopril (PRINIVIL,ZESTRIL) 20 MG tablet  Take 20 mg by mouth daily.     .  Multiple Vitamin (MULTIVITAMIN) tablet  Take 1 tablet by mouth daily.     Marland Kitchen  omeprazole (PRILOSEC) 20 MG capsule  Take 20 mg by mouth daily.     .  pravastatin (PRAVACHOL) 80 MG tablet  Take 80 mg by mouth daily.      Review of Systems  Review of Systems  Constitutional: Negative for fever, chills and unexpected weight change.  HENT: Negative for hearing loss, congestion, sore throat, trouble swallowing and voice change.  Eyes: Negative for visual disturbance.  Respiratory: Positive for apnea. Negative for cough and wheezing.  Cardiovascular: Positive for leg swelling. Negative for chest pain and palpitations.  Gastrointestinal: Negative for nausea, vomiting, abdominal pain, diarrhea, constipation, blood in stool, abdominal distention and anal bleeding.  Genitourinary: Negative for hematuria, vaginal bleeding and difficulty urinating.  Musculoskeletal: Positive for back pain and joint swelling. Negative for arthralgias.  Skin: Negative for rash and wound.  Neurological: Negative for seizures, syncope and headaches.  Hematological: Negative for adenopathy. Does not bruise/bleed easily.    Psychiatric/Behavioral: Negative for confusion.   Blood pressure 123/77, pulse 84, temperature 98.6 F (37 C), temperature source Temporal, resp. rate 18, height 5\' 5"  (1.651 m), weight 306 lb 12.8 oz (139.164 kg).  Physical Exam  Physical Exam  Constitutional: She is oriented to person, place, and time. She appears well-nourished. No distress.  Morbidly obese female in no distress  HENT:  Head: Normocephalic and atraumatic.  Right Ear: External ear normal.  Left Ear: External ear normal.  Nose: Nose normal.  Mouth/Throat: Oropharynx is clear and moist.  Eyes: Conjunctivae and EOM are normal. Pupils are equal, round, and reactive to light. No scleral icterus.  Neck: Normal range of motion. Neck supple. No tracheal deviation present. No thyromegaly present.  Cardiovascular: Normal rate, regular rhythm and intact distal pulses.  Murmur heard.  Pulmonary/Chest: Effort normal and breath sounds normal. No respiratory distress. She has no wheezes.  Abdominal: Soft. Bowel sounds are normal. She exhibits no distension. There is no tenderness.  Musculoskeletal: Normal range of motion. She exhibits edema and tenderness.  Lymphadenopathy:  She has no cervical adenopathy.  She has no axillary adenopathy.  Neurological: She is alert and oriented to person, place, and time.  Skin: Skin is warm and dry. No rash noted. No erythema.  Psychiatric: Her behavior is normal. Judgment normal.  Breast: Her breasts were quite large. There are no palpable masses in either breast. Areola are normal. There is no axillary adenopathy on either side  Data Reviewed  Patient's mammogram demonstrates an area of pleomorphic microcalcifications medially in the right breast in an area slightly larger than 3 cm  Assessment   Abnormal microcalcifications of the right breast   Plan   As she cannot undergo stereotactic biopsy, needle localized excision of these calcifications is recommended for histologic evaluation to  rule out malignancy. I discussed this with her in detail. I discussed the risks of surgery  which includes but is not limited to bleeding, infection, need for further surgery should malignancy be found, cardiopulmonary issues, et Karie Soda. She understands and wishes to proceed. Surgery will be scheduled. Likelihood of success is good   Leona Pressly A

## 2012-03-07 ENCOUNTER — Ambulatory Visit (HOSPITAL_BASED_OUTPATIENT_CLINIC_OR_DEPARTMENT_OTHER)
Admission: RE | Admit: 2012-03-07 | Discharge: 2012-03-07 | Disposition: A | Discharge status: Home / Self Care | Payer: Medicare Other | Source: Ambulatory Visit | Attending: Surgery | Admitting: Surgery

## 2012-03-07 ENCOUNTER — Encounter (HOSPITAL_BASED_OUTPATIENT_CLINIC_OR_DEPARTMENT_OTHER): Payer: Self-pay | Admitting: Anesthesiology

## 2012-03-07 ENCOUNTER — Ambulatory Visit
Admission: RE | Admit: 2012-03-07 | Discharge: 2012-03-07 | Disposition: A | Discharge status: Home / Self Care | Payer: Medicare Other | Source: Ambulatory Visit | Attending: Surgery | Admitting: Surgery

## 2012-03-07 ENCOUNTER — Encounter (HOSPITAL_BASED_OUTPATIENT_CLINIC_OR_DEPARTMENT_OTHER): Payer: Self-pay | Admitting: *Deleted

## 2012-03-07 ENCOUNTER — Encounter (HOSPITAL_BASED_OUTPATIENT_CLINIC_OR_DEPARTMENT_OTHER)
Admission: RE | Disposition: A | Discharge status: Home / Self Care | Payer: Self-pay | Source: Ambulatory Visit | Attending: Surgery

## 2012-03-07 ENCOUNTER — Ambulatory Visit (HOSPITAL_BASED_OUTPATIENT_CLINIC_OR_DEPARTMENT_OTHER): Payer: Medicare Other | Admitting: Anesthesiology

## 2012-03-07 DIAGNOSIS — D249 Benign neoplasm of unspecified breast: Secondary | ICD-10-CM | POA: Insufficient documentation

## 2012-03-07 DIAGNOSIS — F3289 Other specified depressive episodes: Secondary | ICD-10-CM | POA: Insufficient documentation

## 2012-03-07 DIAGNOSIS — K219 Gastro-esophageal reflux disease without esophagitis: Secondary | ICD-10-CM | POA: Insufficient documentation

## 2012-03-07 DIAGNOSIS — F329 Major depressive disorder, single episode, unspecified: Secondary | ICD-10-CM | POA: Insufficient documentation

## 2012-03-07 DIAGNOSIS — R920 Mammographic microcalcification found on diagnostic imaging of breast: Secondary | ICD-10-CM

## 2012-03-07 DIAGNOSIS — J45909 Unspecified asthma, uncomplicated: Secondary | ICD-10-CM | POA: Insufficient documentation

## 2012-03-07 DIAGNOSIS — I10 Essential (primary) hypertension: Secondary | ICD-10-CM | POA: Insufficient documentation

## 2012-03-07 DIAGNOSIS — Z01812 Encounter for preprocedural laboratory examination: Secondary | ICD-10-CM | POA: Insufficient documentation

## 2012-03-07 DIAGNOSIS — E785 Hyperlipidemia, unspecified: Secondary | ICD-10-CM | POA: Insufficient documentation

## 2012-03-07 DIAGNOSIS — R928 Other abnormal and inconclusive findings on diagnostic imaging of breast: Secondary | ICD-10-CM | POA: Insufficient documentation

## 2012-03-07 DIAGNOSIS — E119 Type 2 diabetes mellitus without complications: Secondary | ICD-10-CM | POA: Insufficient documentation

## 2012-03-07 DIAGNOSIS — K08109 Complete loss of teeth, unspecified cause, unspecified class: Secondary | ICD-10-CM | POA: Insufficient documentation

## 2012-03-07 HISTORY — PX: BREAST BIOPSY: SHX20

## 2012-03-07 LAB — POCT HEMOGLOBIN-HEMACUE: Hemoglobin: 11.9 g/dL — ABNORMAL LOW (ref 12.0–15.0)

## 2012-03-07 SURGERY — BREAST BIOPSY WITH NEEDLE LOCALIZATION
Anesthesia: General | Site: Breast | Laterality: Right | Wound class: Clean

## 2012-03-07 MED ORDER — LACTATED RINGERS IV SOLN
INTRAVENOUS | Status: DC
  Administered 2012-03-07 (×3): via INTRAVENOUS

## 2012-03-07 MED ORDER — OXYCODONE HCL 5 MG PO TABS: 5.0000 mg | ORAL_TABLET | ORAL | Status: DC | PRN

## 2012-03-07 MED ORDER — ACETAMINOPHEN 650 MG RE SUPP: 650.0000 mg | RECTAL | Status: DC | PRN

## 2012-03-07 MED ORDER — ONDANSETRON HCL 4 MG/2ML IJ SOLN: 4.0000 mg | Freq: Once | INTRAMUSCULAR | Status: DC | PRN

## 2012-03-07 MED ORDER — SODIUM CHLORIDE 0.9 % IJ SOLN: 3.0000 mL | INTRAMUSCULAR | Status: DC | PRN

## 2012-03-07 MED ORDER — ONDANSETRON HCL 4 MG/2ML IJ SOLN
INTRAMUSCULAR | Status: DC | PRN
  Administered 2012-03-07: 4 mg via INTRAVENOUS

## 2012-03-07 MED ORDER — SODIUM CHLORIDE 0.9 % IV SOLN: 250.0000 mL | INTRAVENOUS | Status: DC | PRN

## 2012-03-07 MED ORDER — METOCLOPRAMIDE HCL 5 MG/ML IJ SOLN
INTRAMUSCULAR | Status: DC | PRN
  Administered 2012-03-07: 10 mg via INTRAVENOUS

## 2012-03-07 MED ORDER — ONDANSETRON HCL 4 MG/2ML IJ SOLN: 4.0000 mg | Freq: Four times a day (QID) | INTRAMUSCULAR | Status: DC | PRN

## 2012-03-07 MED ORDER — CEFAZOLIN SODIUM-DEXTROSE 2-3 GM-% IV SOLR
2.0000 g | INTRAVENOUS | Status: AC
  Administered 2012-03-07: 2 g via INTRAVENOUS

## 2012-03-07 MED ORDER — HYDROMORPHONE HCL PF 1 MG/ML IJ SOLN: 0.2500 mg | INTRAMUSCULAR | Status: DC | PRN

## 2012-03-07 MED ORDER — ACETAMINOPHEN 325 MG PO TABS: 650.0000 mg | ORAL_TABLET | ORAL | Status: DC | PRN

## 2012-03-07 MED ORDER — MORPHINE SULFATE 2 MG/ML IJ SOLN: 1.0000 mg | INTRAMUSCULAR | Status: DC | PRN

## 2012-03-07 MED ORDER — BUPIVACAINE-EPINEPHRINE 0.5% -1:200000 IJ SOLN
INTRAMUSCULAR | Status: DC | PRN
  Administered 2012-03-07: 15 mL

## 2012-03-07 MED ORDER — HYDROCODONE-ACETAMINOPHEN 5-325 MG PO TABS: 1.0000 | ORAL_TABLET | Freq: Four times a day (QID) | ORAL | Status: AC | PRN

## 2012-03-07 MED ORDER — PROPOFOL 10 MG/ML IV EMUL
INTRAVENOUS | Status: DC | PRN
  Administered 2012-03-07: 200 mg via INTRAVENOUS

## 2012-03-07 MED ORDER — FENTANYL CITRATE 0.05 MG/ML IJ SOLN
INTRAMUSCULAR | Status: DC | PRN
  Administered 2012-03-07: 100 ug via INTRAVENOUS

## 2012-03-07 MED ORDER — LIDOCAINE HCL (CARDIAC) 20 MG/ML IV SOLN
INTRAVENOUS | Status: DC | PRN
  Administered 2012-03-07: 30 mg via INTRAVENOUS

## 2012-03-07 MED ORDER — SODIUM CHLORIDE 0.9 % IJ SOLN: 3.0000 mL | Freq: Two times a day (BID) | INTRAMUSCULAR | Status: DC

## 2012-03-07 MED ORDER — MIDAZOLAM HCL 5 MG/5ML IJ SOLN
INTRAMUSCULAR | Status: DC | PRN
  Administered 2012-03-07 (×2): 1 mg via INTRAVENOUS

## 2012-03-07 SURGICAL SUPPLY — 44 items
APL SKNCLS STERI-STRIP NONHPOA (GAUZE/BANDAGES/DRESSINGS) ×1
BENZOIN TINCTURE PRP APPL 2/3 (GAUZE/BANDAGES/DRESSINGS) ×2 IMPLANT
BLADE HEX COATED 2.75 (ELECTRODE) ×2 IMPLANT
BLADE SURG 15 STRL LF DISP TIS (BLADE) ×1 IMPLANT
BLADE SURG 15 STRL SS (BLADE) ×2
CANISTER SUCTION 1200CC (MISCELLANEOUS) IMPLANT
CHLORAPREP W/TINT 26ML (MISCELLANEOUS) ×2 IMPLANT
CLIP TI WIDE RED SMALL 6 (CLIP) IMPLANT
CLOTH BEACON ORANGE TIMEOUT ST (SAFETY) ×2 IMPLANT
COVER MAYO STAND STRL (DRAPES) ×2 IMPLANT
COVER TABLE BACK 60X90 (DRAPES) ×2 IMPLANT
DECANTER SPIKE VIAL GLASS SM (MISCELLANEOUS) IMPLANT
DEVICE DUBIN W/COMP PLATE 8390 (MISCELLANEOUS) ×1 IMPLANT
DRAPE PED LAPAROTOMY (DRAPES) ×2 IMPLANT
DRAPE UTILITY XL STRL (DRAPES) ×2 IMPLANT
DRSG TEGADERM 4X4.75 (GAUZE/BANDAGES/DRESSINGS) ×2 IMPLANT
ELECT REM PT RETURN 9FT ADLT (ELECTROSURGICAL) ×2
ELECTRODE REM PT RTRN 9FT ADLT (ELECTROSURGICAL) ×1 IMPLANT
GAUZE SPONGE 4X4 12PLY STRL LF (GAUZE/BANDAGES/DRESSINGS) ×2 IMPLANT
GLOVE BIOGEL M STRL SZ7.5 (GLOVE) ×1 IMPLANT
GLOVE BIOGEL PI IND STRL 8 (GLOVE) IMPLANT
GLOVE BIOGEL PI INDICATOR 8 (GLOVE) ×1
GLOVE SURG SIGNA 7.5 PF LTX (GLOVE) ×2 IMPLANT
GOWN PREVENTION PLUS XLARGE (GOWN DISPOSABLE) ×1 IMPLANT
GOWN PREVENTION PLUS XXLARGE (GOWN DISPOSABLE) ×3 IMPLANT
KIT MARKER MARGIN INK (KITS) ×2 IMPLANT
NDL HYPO 25X1 1.5 SAFETY (NEEDLE) ×1 IMPLANT
NEEDLE HYPO 25X1 1.5 SAFETY (NEEDLE) ×2 IMPLANT
NS IRRIG 1000ML POUR BTL (IV SOLUTION) ×2 IMPLANT
PACK BASIN DAY SURGERY FS (CUSTOM PROCEDURE TRAY) ×2 IMPLANT
PENCIL BUTTON HOLSTER BLD 10FT (ELECTRODE) ×2 IMPLANT
SLEEVE SCD COMPRESS KNEE MED (MISCELLANEOUS) IMPLANT
SPONGE LAP 4X18 X RAY DECT (DISPOSABLE) ×2 IMPLANT
STRIP CLOSURE SKIN 1/2X4 (GAUZE/BANDAGES/DRESSINGS) ×2 IMPLANT
SUT MNCRL AB 4-0 PS2 18 (SUTURE) ×2 IMPLANT
SUT SILK 2 0 SH (SUTURE) ×2 IMPLANT
SUT VIC AB 3-0 SH 27 (SUTURE) ×4
SUT VIC AB 3-0 SH 27X BRD (SUTURE) ×1 IMPLANT
SYR CONTROL 10ML LL (SYRINGE) ×2 IMPLANT
TOWEL OR 17X24 6PK STRL BLUE (TOWEL DISPOSABLE) ×2 IMPLANT
TOWEL OR NON WOVEN STRL DISP B (DISPOSABLE) ×2 IMPLANT
TUBE CONNECTING 20X1/4 (TUBING) IMPLANT
WATER STERILE IRR 1000ML POUR (IV SOLUTION) ×1 IMPLANT
YANKAUER SUCT BULB TIP NO VENT (SUCTIONS) IMPLANT

## 2012-03-07 NOTE — Anesthesia Preprocedure Evaluation (Addendum)
Anesthesia Evaluation  Patient identified by MRN, date of birth, ID band Patient awake    Reviewed: Allergy & Precautions, H&P , NPO status , Patient's Chart, lab work & pertinent test results  Airway Mallampati: II TM Distance: >3 FB Neck ROM: Full    Dental  (+) Edentulous Upper and Edentulous Lower   Pulmonary  breath sounds clear to auscultation        Cardiovascular Rhythm:Regular Rate:Normal     Neuro/Psych    GI/Hepatic   Endo/Other    Renal/GU      Musculoskeletal   Abdominal (+) + obese,   Peds  Hematology   Anesthesia Other Findings   Reproductive/Obstetrics                           Anesthesia Physical Anesthesia Plan  ASA: III  Anesthesia Plan: General   Post-op Pain Management:    Induction: Intravenous  Airway Management Planned: LMA  Additional Equipment:   Intra-op Plan:   Post-operative Plan: Extubation in OR  Informed Consent: I have reviewed the patients History and Physical, chart, labs and discussed the procedure including the risks, benefits and alternatives for the proposed anesthesia with the patient or authorized representative who has indicated his/her understanding and acceptance.     Plan Discussed with:   Anesthesia Plan Comments: (Breast Mass  Htn GERD Plan GA with LMA)        Anesthesia Quick Evaluation

## 2012-03-07 NOTE — Transfer of Care (Signed)
Immediate Anesthesia Transfer of Care Note  Patient: Felicia Wright  Procedure(s) Performed: Procedure(s) (LRB): BREAST BIOPSY WITH NEEDLE LOCALIZATION (Right)  Patient Location: PACU  Anesthesia Type: General  Level of Consciousness: awake and alert   Airway & Oxygen Therapy: Patient Spontanous Breathing and Patient connected to face mask oxygen  Post-op Assessment: Report given to PACU RN and Post -op Vital signs reviewed and stable  Post vital signs: Reviewed and stable  Complications: No apparent anesthesia complications

## 2012-03-07 NOTE — Anesthesia Procedure Notes (Signed)
Procedure Name: LMA Insertion Date/Time: 03/07/2012 12:55 PM Performed by: Burna Cash Pre-anesthesia Checklist: Patient identified, Emergency Drugs available, Suction available and Patient being monitored Patient Re-evaluated:Patient Re-evaluated prior to inductionOxygen Delivery Method: Circle System Utilized Preoxygenation: Pre-oxygenation with 100% oxygen Intubation Type: IV induction Ventilation: Mask ventilation without difficulty LMA: LMA with gastric port inserted LMA Size: 4.0 Number of attempts: 1 Placement Confirmation: positive ETCO2 Tube secured with: Tape Dental Injury: Teeth and Oropharynx as per pre-operative assessment

## 2012-03-07 NOTE — Anesthesia Postprocedure Evaluation (Signed)
  Anesthesia Post-op Note  Patient: Felicia Wright  Procedure(s) Performed: Procedure(s) (LRB): BREAST BIOPSY WITH NEEDLE LOCALIZATION (Right)  Patient Location: PACU  Anesthesia Type: General  Level of Consciousness: awake, alert  and oriented  Airway and Oxygen Therapy: Patient Spontanous Breathing  Post-op Pain: none  Post-op Assessment: Post-op Vital signs reviewed and Patient's Cardiovascular Status Stable  Post-op Vital Signs: stable  Complications: No apparent anesthesia complications

## 2012-03-07 NOTE — Op Note (Signed)
BREAST BIOPSY WITH NEEDLE LOCALIZATION  Procedure Note  Felicia Wright 03/07/2012   Pre-op Diagnosis: Abnormal calcifications right breast     Post-op Diagnosis: same  Procedure(s): BREAST BIOPSY WITH NEEDLE LOCALIZATION  Surgeon(s): Shelly Rubenstein, MD  Anesthesia: General  Staff:  Macie Burows Ricks, RN - Circulator Julio Sicks, RN - Scrub Person Raliegh Scarlet, RN - Relief Circulator  Estimated Blood Loss: Minimal               Specimens: right breast biopsy         Indications: This is a 61 year old female with abnormal microcalcifications in the right breast. Stereotactic biopsy could not be performed therefore we are proceeding to the operating room for needle localized.  Procedure: The patient had a localization wire placed in the right breast at the breast center. She was then taken to the holding area and then back into the operating room. She was identified as the correct patient. She was placed supine on the operating room table and anesthesia was induced. Her right breast was then prepped and draped in the usual sterile fashion. I anesthetized the skin with Marcaine. I made a transverse incision along the medial portion of the breast in the upper inner quadrant incorporating the localization wire and inserted this down into the breast tissue with the electrocautery. I attempted to perform a wide lumpectomy of the tissue. The breast tissue was very friable and soft in for easily. As I appeared to do the wide lumpectomy a reservoir extended further. I tried to keep this one entire specimen but it became apparent that the specimen was separated. The larger piece the specimen was removed. All margins of this were painted. The wire slightly went more medially and deeper and I took this as a medial and deep margin. X-rays confirmed that the suspicious calcifications were in both specimens. Hemostasis was achieved with cautery. I placed surgical clips in the biopsy  cavity. I then closed the subcutaneous tissue with interrupted 3-0 Vicryl sutures and closed the skin with a running 4-0 Monocryl. Steri-Strips, gauze, and Tegaderm were then applied. The patient tolerated the procedure well. All the counts were correct at the end of the procedure. The patient was then extubated in the operating room and taken in a stable condition to the recovery room.   [name]   Date: 03/07/2012  Time: 1:34 PM

## 2012-03-07 NOTE — Interval H&P Note (Signed)
History and Physical Interval Note:  She has had no change in her history or exam  03/07/2012 12:34 PM  Felicia Wright  has presented today for surgery, with the diagnosis of Abnormal calcifications right breast  The various methods of treatment have been discussed with the patient and family. After consideration of risks, benefits and other options for treatment, the patient has consented to  Procedure(s) (LRB): BREAST BIOPSY WITH NEEDLE LOCALIZATION (Right) as a surgical intervention .  The patients' history has been reviewed, patient examined, no change in status, stable for surgery.  I have reviewed the patients' chart and labs.  Questions were answered to the patient's satisfaction.     Felicia Wright A

## 2012-03-07 NOTE — Discharge Instructions (Signed)
[facility] °Office Phone Number [phone] ° °BREAST BIOPSY/ PARTIAL MASTECTOMY: POST OP INSTRUCTIONS ° °Always review your discharge instruction sheet given to you by the facility where your surgery was performed. ° °IF YOU HAVE DISABILITY OR FAMILY LEAVE FORMS, YOU MUST BRING THEM TO THE OFFICE FOR PROCESSING.  DO NOT GIVE THEM TO YOUR DOCTOR. ° °1. A prescription for pain medication may be given to you upon discharge.  Take your pain medication as prescribed, if needed.  If narcotic pain medicine is not needed, then you may take acetaminophen (Tylenol) or ibuprofen (Advil) as needed. °2. Take your usually prescribed medications unless otherwise directed °3. If you need a refill on your pain medication, please contact your pharmacy.  They will contact our office to request authorization.  Prescriptions will not be filled after 5pm or on week-ends. °4. You should eat very light the first 24 hours after surgery, such as soup, crackers, pudding, etc.  Resume your normal diet the day after surgery. °5. Most patients will experience some swelling and bruising in the breast.  Ice packs and a good support bra will help.  Swelling and bruising can take several days to resolve.  °6. It is common to experience some constipation if taking pain medication after surgery.  Increasing fluid intake and taking a stool softener will usually help or prevent this problem from occurring.  A mild laxative (Milk of Magnesia or Miralax) should be taken according to package directions if there are no bowel movements after 48 hours. °7. Unless discharge instructions indicate otherwise, you may remove your bandages 24-48 hours after surgery, and you may shower at that time.  You may have steri-strips (small skin tapes) in place directly over the incision.  These strips should be left on the skin for 7-10 days.  If your surgeon used skin glue on the incision, you may shower in 24 hours.  The glue will flake off over the  next 2-3 weeks.  Any sutures or staples will be removed at the office during your follow-up visit. °8. ACTIVITIES:  You may resume regular daily activities (gradually increasing) beginning the next day.  Wearing a good support bra or sports bra minimizes pain and swelling.  You may have sexual intercourse when it is comfortable. °a. You may drive when you no longer are taking prescription pain medication, you can comfortably wear a seatbelt, and you can safely maneuver your car and apply brakes. °b. RETURN TO WORK:  ______________________________________________________________________________________ °9. You should see your doctor in the office for a follow-up appointment approximately two weeks after your surgery.  Your doctor’s nurse will typically make your follow-up appointment when she calls you with your pathology report.  Expect your pathology report 2-3 business days after your surgery.  You may call to check if you do not hear from us after three days. °10. OTHER INSTRUCTIONS: _______________________________________________________________________________________________ _____________________________________________________________________________________________________________________________________ °_____________________________________________________________________________________________________________________________________ °_____________________________________________________________________________________________________________________________________ ° °WHEN TO CALL YOUR DOCTOR: °1. Fever over 101.0 °2. Nausea and/or vomiting. °3. Extreme swelling or bruising. °4. Continued bleeding from incision. °5. Increased pain, redness, or drainage from the incision. ° °The clinic staff is available to answer your questions during regular business hours.  Please don’t hesitate to call and ask to speak to one of the nurses for clinical concerns.  If you have a medical emergency, go to the nearest  emergency room or call 911.  A surgeon from [facility] is always on call at the hospital. ° °For further questions, please visit [email or web address]  ° ° °  Post Anesthesia Home Care Instructions ° °Activity: °Get plenty of rest for the remainder of the day. A responsible adult should stay with you for 24 hours following the procedure.  °For the next 24 hours, DO NOT: °-Drive a car °-Operate machinery °-Drink alcoholic beverages °-Take any medication unless instructed by your physician °-Make any legal decisions or sign important papers. ° °Meals: °Start with liquid foods such as gelatin or soup. Progress to regular foods as tolerated. Avoid greasy, spicy, heavy foods. If nausea and/or vomiting occur, drink only clear liquids until the nausea and/or vomiting subsides. Call your physician if vomiting continues. ° °Special Instructions/Symptoms: °Your throat may feel dry or sore from the anesthesia or the breathing tube placed in your throat during surgery. If this causes discomfort, gargle with warm salt water. The discomfort should disappear within 24 hours. ° ° °Call your surgeon if you experience:  ° °1.  Fever over 101.0. °2.  Inability to urinate. °3.  Nausea and/or vomiting. °4.  Extreme swelling or bruising at the surgical site. °5.  Continued bleeding from the incision. °6.  Increased pain, redness or drainage from the incision. °7.  Problems related to your pain medication. ° ° °

## 2012-03-09 ENCOUNTER — Encounter (HOSPITAL_BASED_OUTPATIENT_CLINIC_OR_DEPARTMENT_OTHER): Payer: Self-pay | Admitting: Surgery

## 2012-03-16 ENCOUNTER — Telehealth (INDEPENDENT_AMBULATORY_CARE_PROVIDER_SITE_OTHER): Payer: Self-pay | Admitting: General Surgery

## 2012-03-16 NOTE — Telephone Encounter (Signed)
Pt calling to report that, first thing in the morning, she can feel some fluid "sloshing" under the incision.  It is not painful or swollen, just there.  Otherwise, all is well.  Reassured pt that the fluid will likely be reabsorbed by the body, but to call back if it worsens before her recheck appt in 10 days.  She understands and will comply.

## 2012-03-26 ENCOUNTER — Ambulatory Visit (INDEPENDENT_AMBULATORY_CARE_PROVIDER_SITE_OTHER): Payer: Medicare Other | Admitting: Surgery

## 2012-03-26 ENCOUNTER — Encounter (INDEPENDENT_AMBULATORY_CARE_PROVIDER_SITE_OTHER): Payer: Self-pay | Admitting: Surgery

## 2012-03-26 VITALS — BP 110/52 | HR 70 | Temp 95.7°F | Ht 65.0 in | Wt 304.6 lb

## 2012-03-26 DIAGNOSIS — Z09 Encounter for follow-up examination after completed treatment for conditions other than malignant neoplasm: Secondary | ICD-10-CM

## 2012-03-26 NOTE — Progress Notes (Signed)
Subjective:     Patient ID: Felicia Wright, female   DOB: 25-Jul-1951, 61 y.o.   MRN: [id number]  HPI She is here for her first postoperative visit status post excisional biopsy of abnormal microcalcifications of the right breast. She is doing well.  Review of Systems     Objective:   Physical Exam    Her right breast incision is well-healed. The pathology showed benign calcifications with no evidence of atypia or malignancyAssessment:     Patient status post right breast biopsy    Plan:     She will continue self exams and yearly mammograms. I will see her as needed

## 2012-07-25 IMAGING — MG MM DIAGNOSTIC UNILATERAL R
2 series · 2 of 2 positions shown · non-contrast
Comparison: Multiple priors

CLINICAL DATA: Abnormal screening mammogram, right breast

DIGITAL DIAGNOSTIC RIGHT MAMMOGRAM WITHOUT CAD

[R CC]
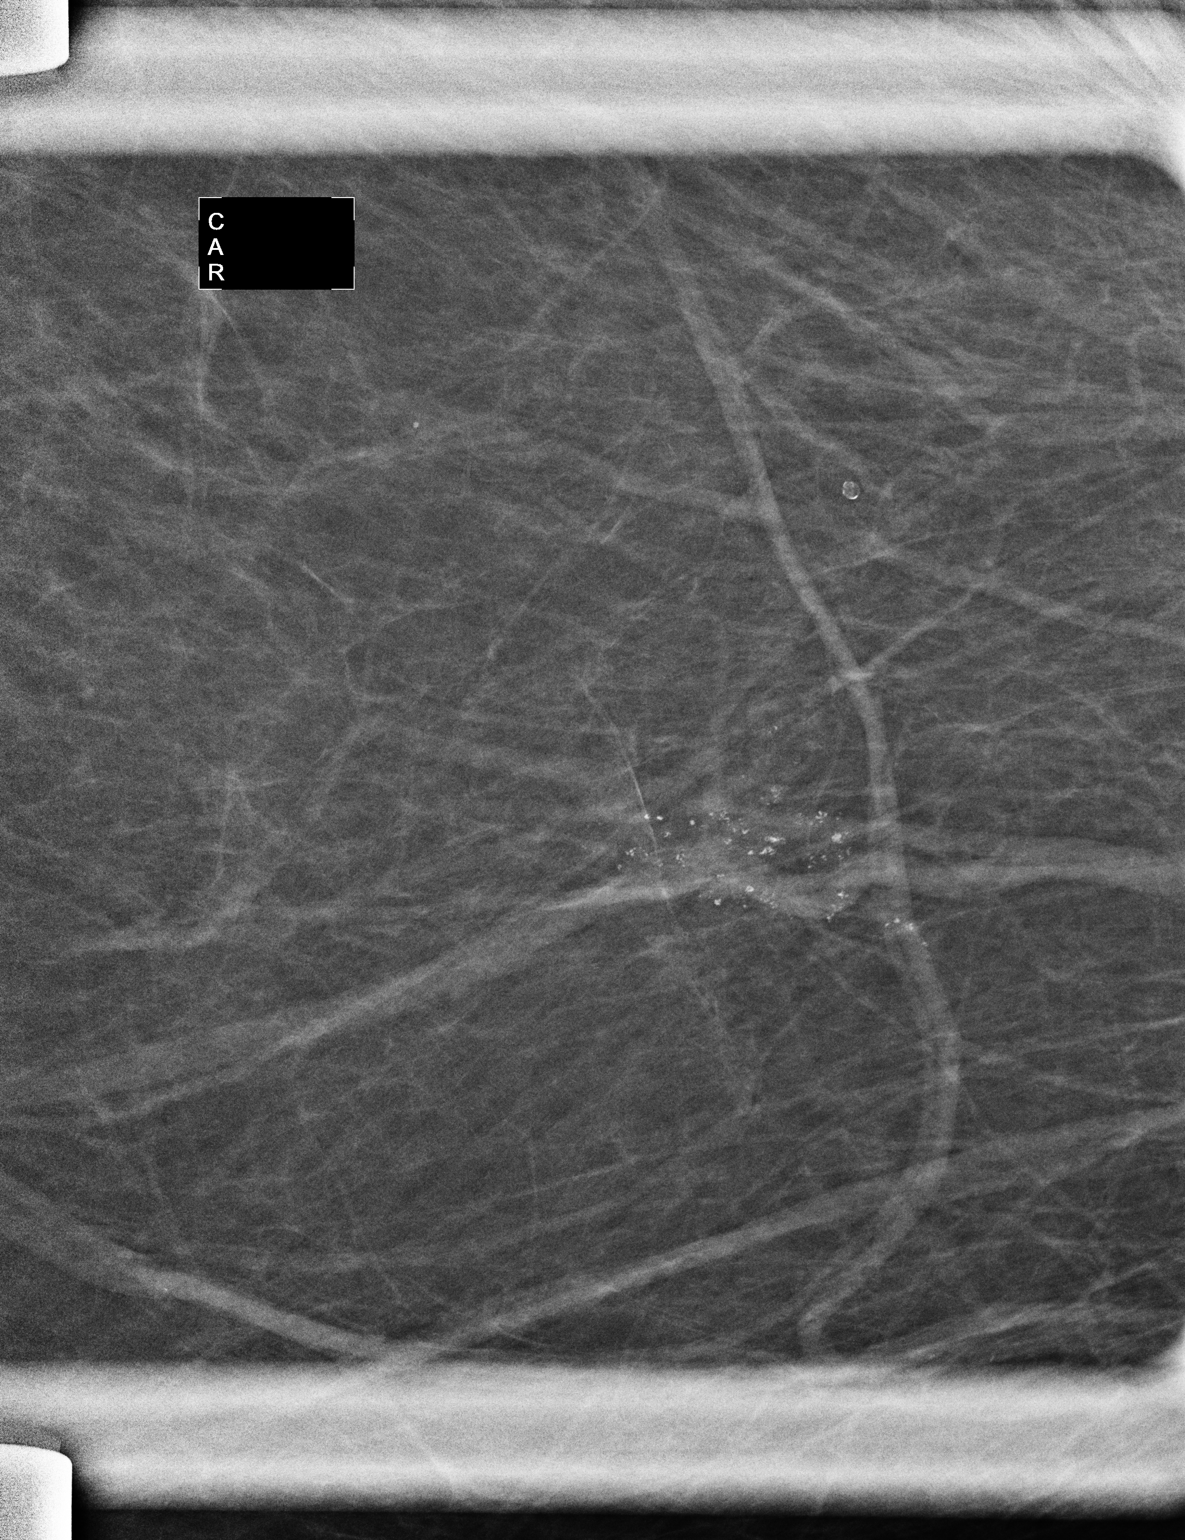

[R ML]
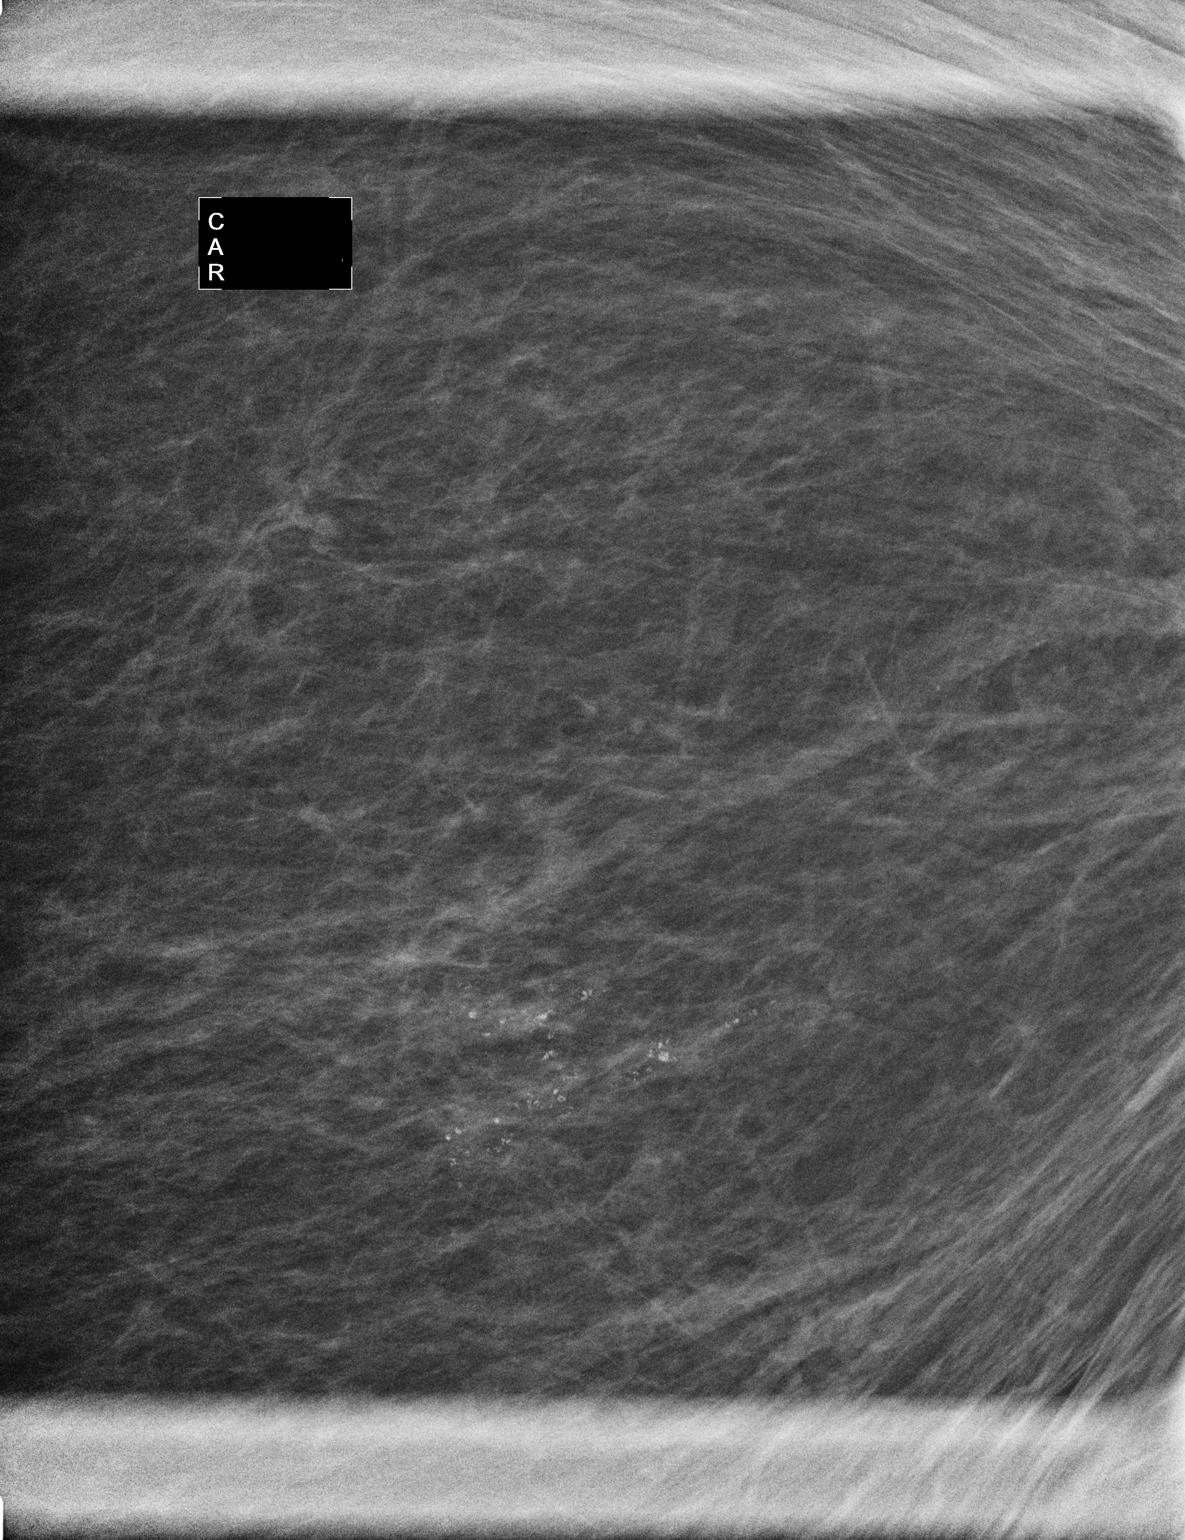

[2 of 2 positions shown; findings below may reference images not displayed]

FINDINGS: Spot magnification views confirm a cluster of
calcifications in the medial central aspect of the right breast,
middle third.  Calcifications are pleomorphic and measure 3.3 x
cm.

A biopsy is recommended.  The patient exceeds the weight limit for
the stereotactic biopsy table.  Therefore, surgical consultation is
made with the patient at [facility]HOSPITAL[facility] at the request of
the patient.
IMPRESSION: Calcifications, right breast for which an excisional biopsy is
recommended.  An appointment is made with Dr. Jumper on [REDACTED],
[DATE] at 2 pm.

BI-RADS CATEGORY 4:  Suspicious abnormality - biopsy should be
considered.

## 2012-10-21 ENCOUNTER — Ambulatory Visit: Payer: Medicare Other | Attending: Neurology | Admitting: Sleep Medicine

## 2012-10-21 DIAGNOSIS — G4730 Sleep apnea, unspecified: Secondary | ICD-10-CM

## 2012-10-21 DIAGNOSIS — G4733 Obstructive sleep apnea (adult) (pediatric): Secondary | ICD-10-CM | POA: Insufficient documentation

## 2012-10-26 ENCOUNTER — Ambulatory Visit (HOSPITAL_COMMUNITY)
Admission: RE | Admit: 2012-10-26 | Discharge: 2012-10-26 | Disposition: A | Discharge status: Home / Self Care | Payer: Medicare Other | Source: Ambulatory Visit | Attending: Family Medicine | Admitting: Family Medicine

## 2012-10-26 ENCOUNTER — Other Ambulatory Visit (HOSPITAL_COMMUNITY): Payer: Self-pay | Admitting: Family Medicine

## 2012-10-26 DIAGNOSIS — M19019 Primary osteoarthritis, unspecified shoulder: Secondary | ICD-10-CM | POA: Insufficient documentation

## 2012-10-26 DIAGNOSIS — W19XXXA Unspecified fall, initial encounter: Secondary | ICD-10-CM

## 2012-10-26 NOTE — Procedures (Signed)
[facility] Athene Schuhmacher A. Gerilyn Pilgrim, MD     [email or web address]        NAMEJOSLYNE, Felicia Wright              ACCOUNT NO.:  [id number]  MEDICAL RECORD NO.:  [id number]          PATIENT TYPE:  OUT  LOCATION:  SLEEP LAB                     FACILITY:  [facility]  PHYSICIAN:  Adileny Delon A. Gerilyn Pilgrim, M.D. DATE OF BIRTH:  05/01/1951  DATE OF STUDY:  10/21/2012                           NOCTURNAL POLYSOMNOGRAM  REFERRING PHYSICIAN:  Melvyn Novas, MD  REFERRING PHYSICIAN:  Teana Lindahl A. Gerilyn Pilgrim, MD  INDICATION:  A 61 year old who presents with witnessed apnea, fatigue, and snoring.  There is a past history of obstructive sleep apnea syndrome.   MEDICATIONS:  Ketoprofen, pravastatin, lisinopril, Prilosec, Celebrex, glucosamine, vitamin D, multivitamin, zinc, aspirin, Voltaren gel.  EPWORTH SLEEPINESS SCALE:  9.  BMI:  51.  ARCHITECTURAL SUMMARY:  This is a split night recording with initial portion being a diagnostic and second portion a titration recording. The total recording time was 452 minutes.  Sleep efficiency 67%.  Sleep latency 9 minutes.  REM latency 211 minutes.  RESPIRATORY SUMMARY:  Baseline oxygen saturation is 98, lowest saturation 78.  Diagnostic AHI 69.  The patient was placed on positive pressure between 5 and 13, optimal pressure 13 with good tolerance.  LIMB MOVEMENT SUMMARY:  PLM index 0.  ELECTROCARDIOGRAM SUMMARY:  Average heart rate is 66.  IMPRESSION:  Severe obstructive sleep apnea syndrome which responds well to a CPAP of 13.     Averyana Pillars A. Gerilyn Pilgrim, M.D.    KAD/MEDQ  D:  10/26/2012 10:00:18  T:  10/26/2012 10:14:16  Job:  [id number]

## 2012-12-18 ENCOUNTER — Other Ambulatory Visit (HOSPITAL_COMMUNITY): Payer: Self-pay | Admitting: Family Medicine

## 2012-12-18 ENCOUNTER — Other Ambulatory Visit: Payer: Self-pay | Admitting: Family Medicine

## 2012-12-18 DIAGNOSIS — Z139 Encounter for screening, unspecified: Secondary | ICD-10-CM

## 2013-01-14 ENCOUNTER — Telehealth (HOSPITAL_COMMUNITY): Payer: Self-pay | Admitting: Dietician

## 2013-01-14 NOTE — Telephone Encounter (Signed)
Received voicemail from pt left at 1019. Pt inquiring about services. She reports her doctor told her to lose 100#. Pt previous pt of Burnard Hawthorne, RD. RD notes reviewed; pt saw RD from 01/29/08-09/14/09 and successfully lost 51# and lowered A1c from 6 to 5.9.  Called back and left voicemail at 1210.

## 2013-01-14 NOTE — Telephone Encounter (Signed)
Pt called back at 1237. Appointment scheduled for 01/15/13 at 10 AM. She reports she gave up flour and sugar for lent. She reports her PCP sent her to the bariatric sx informational session, but she does not want to go through with sx and would rather lose weight with lifestyle changes. She is keeping a Futures trader. .

## 2013-01-15 ENCOUNTER — Encounter (HOSPITAL_COMMUNITY): Payer: Self-pay | Admitting: Dietician
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
## 2013-01-28 ENCOUNTER — Ambulatory Visit (HOSPITAL_COMMUNITY)
Admission: RE | Admit: 2013-01-28 | Discharge: 2013-01-28 | Disposition: A | Discharge status: Home / Self Care | Payer: Medicare Other | Source: Ambulatory Visit | Attending: Family Medicine | Admitting: Family Medicine

## 2013-01-28 DIAGNOSIS — Z139 Encounter for screening, unspecified: Secondary | ICD-10-CM

## 2013-01-28 DIAGNOSIS — Z1231 Encounter for screening mammogram for malignant neoplasm of breast: Secondary | ICD-10-CM | POA: Insufficient documentation

## 2013-02-12 ENCOUNTER — Encounter (HOSPITAL_COMMUNITY): Payer: Self-pay | Admitting: Dietician

## 2013-02-12 NOTE — Progress Notes (Signed)
Follow-Up Outpatient Nutrition Note Date: 02/12/2013  Appt Start Time: 1008  Nutrition Assessment:  Current weight: Weight: 270 lb (122.471 kg)  BMI: Body mass index is 43.6 kg/(m^2).  Weight changes: -11# (3.9%) x 1 month  Felicia Wright has made excellent progress with her weight loss. She wants to know if she is "eating right". She reports that she is scheduled for shoulder reapir on 02/28/13, although she laments that she wishes that she did not have to go through surgery. She is requesting an updated copy of "Carbohydrate Counting and Meal Planning" pamphlet. She reports frustration that she cannot find an updated version of Calorie King.  CBGs range from 86-108.  She started exercising last week; she is either walking or going on the bike. She reports it is difficult to walk when it is raining. She reports she "feels exhausted" due to her shoulder pain.  She also reports frustration with several of her neighbors and friends who attend the gym. She reports her neighbors tell her that she does not need to lose weight and that frustrates her. It also upsets her when people try to socialize with her at the gym when she is trying to work out. She reports "I"m trying to work out and do my best, but all they want to do is sit around and talk and not exercise".  She continues to keep a very detailed food diary. Daily calorie intake ranges from [phone] kcals daily, averaging around [phone] kcals daily. She admits to snacking in front of the TV. She reports she no longer uses salad dressing because "it has too many calories" and now uses lemon juice. She uses mustard to dip her carrots in. She also drinks only water. She has eliminated sugar free drink mixes from her diet "because Dr. Neil Crouch says they're bad for you".   Labs: CMP     Component Value Date/Time   NA 142 03/05/2012 1130   K 4.4 03/05/2012 1130   CL 104 03/05/2012 1130   CO2 26 03/05/2012 1130   GLUCOSE 87 03/05/2012 1130   BUN 15 03/05/2012 1130   CREATININE 0.76 03/05/2012 1130   CALCIUM 10.0 03/05/2012 1130   PROT 6.9 06/19/2009 1256   ALBUMIN 4.3 06/19/2009 1256   AST 20 06/19/2009 1256   ALT 12 06/19/2009 1256   ALKPHOS 62 06/19/2009 1256   BILITOT 0.9 06/19/2009 1256   GFRNONAA 89* 03/05/2012 1130   GFRAA >90 03/05/2012 1130    Lipid Panel     Component Value Date/Time   CHOL 144 06/03/2009 2311   TRIG 96 06/03/2009 2311   HDL 61 06/03/2009 2311   CHOLHDL 2.4 Ratio 06/03/2009 2311   VLDL 19 06/03/2009 2311   LDLCALC 64 06/03/2009 2311     Lab Results  Component Value Date   HGBA1C 5.7 07/22/2009   HGBA1C 5.8 06/03/2009   HGBA1C 5.4 03/04/2009   Lab Results  Component Value Date   MICROALBUR 0.21 11/13/2008   LDLCALC 64 06/03/2009   CREATININE 0.76 03/05/2012     Diet recall: Breakfast: 1 scoop of raw fit powder, 2 cups Activia Light; Lunch: garden salad with elmon juice, 1 can of tuna, 2 small mandarin oranges; Snack: [employer] gluten free granola bars; Dinner: 2 backed chicken wings, 1 large baked apple, 6 slices pickled beets. Beverages consist of water only.   Nutrition Diagnosis: Involuntary weight gain r/t physical inactivity AEB 27# wt gain x 4 years.   Nutrition Intervention: Nutrition rx: 1500 kcal NAS, diabetic  diet; 3 meals per day (45-60 grams carbohydrate per meal); limit snacks; low calorie beverages only; 30 minutes physical activity 5 times per week  Education/ counseling provided: Closely reviewed food diary with patient and discussed ways to help her achieve a healthier lifestyle. Discussed dangers of a hypocaloric diet and emphasized importance of consuming at least 1200 kcals daily, in order to support and maintain healthy weight loss as well as support adequate nutrition. Reviewed portion sizes and discussed other alternatives to salad dressings and dips. Also discussed importance of regular physical activity. Discussed importance of eating 3 meals per day for optimal glycemic control. Discussed importance of  maintaining a consistent eating schedule. Emphasized importance of making practical changes that pt will be able to follow long term to maintain weight loss. Provided emotional support. Praised pt for progress made. Provided new copy of "Carbohydrate Counting and Meal Planning" to pt upon request.   Understanding/Motivation/ Ability to follow recommendations: Expect fair to good compliance.   Monitoring and Evaluation: Previous Goals: 1) 1-2# wt loss per week- goal met; 2) 3 meals per day- progressing; 3) 30 minutess physical activity 5 times per week- progressing  Goals for next visit: 1) 1-2# wt loss per week; 2) 3 meals per day; 3) 30 minutes physical activity 5 times per week  Recommendations: 1) Consume no less than 1200 kcals daily; 2) Break up physical activity into smaller, more frequent sessions; 3) Listen to music when on treadmill or out walking; 4) Try hummus as a dip with vegetables  F/U: 4-6 weeks. F/U scheduled for Tuesday, 03/19/13 at 1000.   Melody Haver, RD, LDN Date:02/12/2013 Appt EndTime: 1108

## 2013-02-18 ENCOUNTER — Encounter (HOSPITAL_COMMUNITY): Payer: Self-pay | Admitting: Pharmacy Technician

## 2013-02-22 ENCOUNTER — Encounter (HOSPITAL_COMMUNITY): Payer: Self-pay

## 2013-02-22 ENCOUNTER — Encounter (HOSPITAL_COMMUNITY)
Admission: RE | Admit: 2013-02-22 | Discharge: 2013-02-22 | Disposition: A | Discharge status: Home / Self Care | Payer: Medicare Other | Source: Ambulatory Visit | Attending: Orthopedic Surgery | Admitting: Orthopedic Surgery

## 2013-02-22 DIAGNOSIS — I10 Essential (primary) hypertension: Secondary | ICD-10-CM | POA: Insufficient documentation

## 2013-02-22 DIAGNOSIS — I517 Cardiomegaly: Secondary | ICD-10-CM | POA: Insufficient documentation

## 2013-02-22 DIAGNOSIS — G4730 Sleep apnea, unspecified: Secondary | ICD-10-CM | POA: Insufficient documentation

## 2013-02-22 DIAGNOSIS — K219 Gastro-esophageal reflux disease without esophagitis: Secondary | ICD-10-CM | POA: Insufficient documentation

## 2013-02-22 DIAGNOSIS — Z01818 Encounter for other preprocedural examination: Secondary | ICD-10-CM | POA: Insufficient documentation

## 2013-02-22 DIAGNOSIS — Z01812 Encounter for preprocedural laboratory examination: Secondary | ICD-10-CM | POA: Insufficient documentation

## 2013-02-22 DIAGNOSIS — Z87891 Personal history of nicotine dependence: Secondary | ICD-10-CM | POA: Insufficient documentation

## 2013-02-22 DIAGNOSIS — J45909 Unspecified asthma, uncomplicated: Secondary | ICD-10-CM | POA: Insufficient documentation

## 2013-02-22 DIAGNOSIS — E119 Type 2 diabetes mellitus without complications: Secondary | ICD-10-CM | POA: Insufficient documentation

## 2013-02-22 LAB — BASIC METABOLIC PANEL
CO2: 26 mEq/L (ref 19–32)
Calcium: 10 mg/dL (ref 8.4–10.5)
Creatinine, Ser: 0.85 mg/dL (ref 0.50–1.10)
GFR calc Af Amer: 83 mL/min — ABNORMAL LOW (ref >90)
GFR calc non Af Amer: 71 mL/min — ABNORMAL LOW (ref >90)
Sodium: 132 mEq/L — ABNORMAL LOW (ref 135–145)

## 2013-02-22 LAB — CBC
Platelets: 124 10*3/uL — ABNORMAL LOW (ref 150–400)
RBC: 4.18 MIL/uL (ref 3.87–5.11)
RDW: 14.1 % (ref 11.5–15.5)
WBC: 5.5 10*3/uL (ref 4.0–10.5)

## 2013-02-22 LAB — TYPE AND SCREEN
ABO/RH(D): A POS
Antibody Screen: NEGATIVE

## 2013-02-22 LAB — SURGICAL PCR SCREEN
MRSA, PCR: NEGATIVE
Staphylococcus aureus: NEGATIVE

## 2013-02-22 LAB — ABO/RH: ABO/RH(D): A POS

## 2013-02-22 NOTE — Progress Notes (Signed)
Called office to notify that we do not have TED hose to fit pt. Office Closed.

## 2013-02-22 NOTE — Pre-Procedure Instructions (Signed)
Felicia Wright  02/22/2013   Your procedure is scheduled on:  02-28-2013  Thursday  Report to [facility] at *7:30 AM.Take East Elevators to the 3rd floor  Call this number if you have problems the morning of surgery: [phone]   Remember:   Do not eat food or drink liquids after midnight.    Take these medicines the morning of surgery with A SIP OF WATER:omeprazole(prilosec)    Do not wear jewelry, make-up or nail polish.  Do not wear lotions, powders, or perfumes.   Do not shave 48 hours prior to surgery.   Do not bring valuables to the hospital.  Contacts, dentures or bridgework may not be worn into surgery.   Leave suitcase in the car. After surgery it may be brought to your room.   For patients admitted to the hospital, checkout time is 11:00 AM the day of discharge.   Patients discharged the day of surgery will not be allowed to drive home.    Special Instructions: Shower using CHG 2 nights before surgery and the night before surgery.  If you shower the day of surgery use CHG.  Use special wash - you have one bottle of CHG for all showers.  You should use approximately 1/3 of the bottle for each shower.   Please read over the following fact sheets that you were given: Pain Booklet, Coughing and Deep Breathing, Blood Transfusion Information and Surgical Site Infection Prevention

## 2013-02-27 MED ORDER — DEXTROSE 5 % IV SOLN
3.0000 g | INTRAVENOUS | Status: AC
  Administered 2013-02-28: 3 g via INTRAVENOUS
  Filled 2013-02-27: qty 3000

## 2013-02-28 ENCOUNTER — Encounter (HOSPITAL_COMMUNITY): Payer: Self-pay | Admitting: *Deleted

## 2013-02-28 ENCOUNTER — Encounter (HOSPITAL_COMMUNITY)
Admission: RE | Disposition: A | Discharge status: Home / Self Care | Payer: Self-pay | Source: Ambulatory Visit | Attending: Orthopedic Surgery

## 2013-02-28 ENCOUNTER — Encounter (HOSPITAL_COMMUNITY): Payer: Self-pay | Admitting: Certified Registered"

## 2013-02-28 ENCOUNTER — Inpatient Hospital Stay (HOSPITAL_COMMUNITY): Payer: Medicare Other | Admitting: Certified Registered"

## 2013-02-28 ENCOUNTER — Inpatient Hospital Stay (HOSPITAL_COMMUNITY)
Admission: RE | Admit: 2013-02-28 | Discharge: 2013-03-01 | DRG: 484 | Disposition: A | Discharge status: Home / Self Care | Payer: Medicare Other | Source: Ambulatory Visit | Attending: Orthopedic Surgery | Admitting: Orthopedic Surgery

## 2013-02-28 DIAGNOSIS — Z833 Family history of diabetes mellitus: Secondary | ICD-10-CM

## 2013-02-28 DIAGNOSIS — E119 Type 2 diabetes mellitus without complications: Secondary | ICD-10-CM | POA: Diagnosis present

## 2013-02-28 DIAGNOSIS — Z825 Family history of asthma and other chronic lower respiratory diseases: Secondary | ICD-10-CM

## 2013-02-28 DIAGNOSIS — Z87891 Personal history of nicotine dependence: Secondary | ICD-10-CM

## 2013-02-28 DIAGNOSIS — Z8249 Family history of ischemic heart disease and other diseases of the circulatory system: Secondary | ICD-10-CM

## 2013-02-28 DIAGNOSIS — K589 Irritable bowel syndrome without diarrhea: Secondary | ICD-10-CM | POA: Diagnosis present

## 2013-02-28 DIAGNOSIS — G4730 Sleep apnea, unspecified: Secondary | ICD-10-CM | POA: Diagnosis present

## 2013-02-28 DIAGNOSIS — K219 Gastro-esophageal reflux disease without esophagitis: Secondary | ICD-10-CM | POA: Diagnosis present

## 2013-02-28 DIAGNOSIS — M19019 Primary osteoarthritis, unspecified shoulder: Secondary | ICD-10-CM

## 2013-02-28 DIAGNOSIS — Z8261 Family history of arthritis: Secondary | ICD-10-CM

## 2013-02-28 DIAGNOSIS — G40909 Epilepsy, unspecified, not intractable, without status epilepticus: Secondary | ICD-10-CM | POA: Diagnosis present

## 2013-02-28 DIAGNOSIS — Z79899 Other long term (current) drug therapy: Secondary | ICD-10-CM

## 2013-02-28 DIAGNOSIS — I10 Essential (primary) hypertension: Secondary | ICD-10-CM | POA: Diagnosis present

## 2013-02-28 DIAGNOSIS — N318 Other neuromuscular dysfunction of bladder: Secondary | ICD-10-CM | POA: Diagnosis present

## 2013-02-28 DIAGNOSIS — E785 Hyperlipidemia, unspecified: Secondary | ICD-10-CM | POA: Diagnosis present

## 2013-02-28 DIAGNOSIS — J45909 Unspecified asthma, uncomplicated: Secondary | ICD-10-CM | POA: Diagnosis present

## 2013-02-28 HISTORY — PX: TOTAL SHOULDER ARTHROPLASTY: SHX126

## 2013-02-28 LAB — GLUCOSE, CAPILLARY
Glucose-Capillary: 89 mg/dL (ref 70–99)
Glucose-Capillary: 109 mg/dL — ABNORMAL HIGH (ref 70–99)

## 2013-02-28 SURGERY — ARTHROPLASTY, SHOULDER, TOTAL
Anesthesia: General | Site: Shoulder | Laterality: Right | Wound class: Clean

## 2013-02-28 MED ORDER — PANTOPRAZOLE SODIUM 40 MG PO TBEC
40.0000 mg | DELAYED_RELEASE_TABLET | Freq: Every day | ORAL | Status: DC
  Administered 2013-03-01: 40 mg via ORAL
  Filled 2013-02-28 (×2): qty 1

## 2013-02-28 MED ORDER — PROMETHAZINE HCL 25 MG/ML IJ SOLN: 6.2500 mg | INTRAMUSCULAR | Status: DC | PRN

## 2013-02-28 MED ORDER — LACTATED RINGERS IV SOLN: INTRAVENOUS | Status: DC

## 2013-02-28 MED ORDER — KETOROLAC TROMETHAMINE 15 MG/ML IJ SOLN
15.0000 mg | Freq: Four times a day (QID) | INTRAMUSCULAR | Status: DC
  Administered 2013-02-28 – 2013-03-01 (×3): 15 mg via INTRAVENOUS
  Filled 2013-02-28 (×8): qty 1

## 2013-02-28 MED ORDER — OXYCODONE-ACETAMINOPHEN 5-325 MG PO TABS: 1.0000 | ORAL_TABLET | ORAL | Status: DC | PRN

## 2013-02-28 MED ORDER — MIDAZOLAM HCL 5 MG/5ML IJ SOLN
INTRAMUSCULAR | Status: DC | PRN
  Administered 2013-02-28: 2 mg via INTRAVENOUS

## 2013-02-28 MED ORDER — DOCUSATE SODIUM 100 MG PO CAPS
100.0000 mg | ORAL_CAPSULE | Freq: Two times a day (BID) | ORAL | Status: DC
  Administered 2013-02-28 – 2013-03-01 (×2): 100 mg via ORAL
  Filled 2013-02-28 (×2): qty 1

## 2013-02-28 MED ORDER — DEXAMETHASONE SODIUM PHOSPHATE 4 MG/ML IJ SOLN
INTRAMUSCULAR | Status: DC | PRN
  Administered 2013-02-28: 4 mg

## 2013-02-28 MED ORDER — HYDROMORPHONE HCL PF 1 MG/ML IJ SOLN
0.2500 mg | INTRAMUSCULAR | Status: DC | PRN
  Administered 2013-02-28: 0.5 mg via INTRAVENOUS

## 2013-02-28 MED ORDER — LACTATED RINGERS IV SOLN
INTRAVENOUS | Status: DC
  Administered 2013-02-28 (×2): via INTRAVENOUS

## 2013-02-28 MED ORDER — ACETAMINOPHEN 650 MG RE SUPP: 650.0000 mg | Freq: Four times a day (QID) | RECTAL | Status: DC | PRN

## 2013-02-28 MED ORDER — HYDROMORPHONE HCL PF 1 MG/ML IJ SOLN: 0.2500 mg | INTRAMUSCULAR | Status: DC | PRN

## 2013-02-28 MED ORDER — OXYCODONE HCL 5 MG/5ML PO SOLN: 5.0000 mg | Freq: Once | ORAL | Status: DC | PRN

## 2013-02-28 MED ORDER — ONDANSETRON HCL 4 MG/2ML IJ SOLN: 4.0000 mg | Freq: Four times a day (QID) | INTRAMUSCULAR | Status: DC | PRN

## 2013-02-28 MED ORDER — OXYCODONE-ACETAMINOPHEN 5-325 MG PO TABS
1.0000 | ORAL_TABLET | ORAL | Status: DC | PRN
  Administered 2013-02-28 – 2013-03-01 (×2): 2 via ORAL
  Filled 2013-02-28 (×2): qty 2

## 2013-02-28 MED ORDER — OXYCODONE HCL 5 MG PO TABS: 5.0000 mg | ORAL_TABLET | Freq: Once | ORAL | Status: DC | PRN

## 2013-02-28 MED ORDER — DIPHENHYDRAMINE HCL 12.5 MG/5ML PO ELIX: 12.5000 mg | ORAL_SOLUTION | ORAL | Status: DC | PRN

## 2013-02-28 MED ORDER — TEMAZEPAM 15 MG PO CAPS: 15.0000 mg | ORAL_CAPSULE | Freq: Every evening | ORAL | Status: DC | PRN

## 2013-02-28 MED ORDER — METOCLOPRAMIDE HCL 10 MG PO TABS: 5.0000 mg | ORAL_TABLET | Freq: Three times a day (TID) | ORAL | Status: DC | PRN

## 2013-02-28 MED ORDER — SODIUM CHLORIDE 0.9 % IR SOLN
Status: DC | PRN
  Administered 2013-02-28: 1000 mL

## 2013-02-28 MED ORDER — CHLORHEXIDINE GLUCONATE 4 % EX LIQD: 60.0000 mL | Freq: Once | CUTANEOUS | Status: DC

## 2013-02-28 MED ORDER — CYCLOBENZAPRINE HCL 5 MG PO TABS: 5.0000 mg | ORAL_TABLET | Freq: Three times a day (TID) | ORAL | Status: DC | PRN

## 2013-02-28 MED ORDER — ARTIFICIAL TEARS OP OINT
TOPICAL_OINTMENT | OPHTHALMIC | Status: DC | PRN
  Administered 2013-02-28: 1 via OPHTHALMIC

## 2013-02-28 MED ORDER — SUCCINYLCHOLINE CHLORIDE 20 MG/ML IJ SOLN
INTRAMUSCULAR | Status: DC | PRN
  Administered 2013-02-28: 160 mg via INTRAVENOUS

## 2013-02-28 MED ORDER — SIMVASTATIN 40 MG PO TABS
40.0000 mg | ORAL_TABLET | Freq: Every day | ORAL | Status: DC
  Filled 2013-02-28 (×2): qty 1

## 2013-02-28 MED ORDER — ACETAMINOPHEN 325 MG PO TABS: 650.0000 mg | ORAL_TABLET | Freq: Four times a day (QID) | ORAL | Status: DC | PRN

## 2013-02-28 MED ORDER — METOCLOPRAMIDE HCL 5 MG/ML IJ SOLN: 5.0000 mg | Freq: Three times a day (TID) | INTRAMUSCULAR | Status: DC | PRN

## 2013-02-28 MED ORDER — LIDOCAINE HCL (CARDIAC) 20 MG/ML IV SOLN
INTRAVENOUS | Status: DC | PRN
  Administered 2013-02-28: 100 mg via INTRAVENOUS

## 2013-02-28 MED ORDER — LISINOPRIL 20 MG PO TABS
20.0000 mg | ORAL_TABLET | Freq: Every day | ORAL | Status: DC
  Administered 2013-02-28 – 2013-03-01 (×2): 20 mg via ORAL
  Filled 2013-02-28 (×2): qty 1

## 2013-02-28 MED ORDER — FENTANYL CITRATE 0.05 MG/ML IJ SOLN
INTRAMUSCULAR | Status: DC | PRN
  Administered 2013-02-28: 50 ug via INTRAVENOUS

## 2013-02-28 MED ORDER — ALUM & MAG HYDROXIDE-SIMETH 200-200-20 MG/5ML PO SUSP: 30.0000 mL | ORAL | Status: DC | PRN

## 2013-02-28 MED ORDER — HYDROMORPHONE HCL PF 1 MG/ML IJ SOLN
INTRAMUSCULAR | Status: AC
  Filled 2013-02-28: qty 1

## 2013-02-28 MED ORDER — BUPIVACAINE-EPINEPHRINE PF 0.5-1:200000 % IJ SOLN
INTRAMUSCULAR | Status: DC | PRN
  Administered 2013-02-28: 25 mL

## 2013-02-28 MED ORDER — MENTHOL 3 MG MT LOZG: 1.0000 | LOZENGE | OROMUCOSAL | Status: DC | PRN

## 2013-02-28 MED ORDER — ONDANSETRON HCL 4 MG/2ML IJ SOLN
INTRAMUSCULAR | Status: DC | PRN
  Administered 2013-02-28: 4 mg via INTRAVENOUS

## 2013-02-28 MED ORDER — SODIUM CHLORIDE 0.9 % IV SOLN
10.0000 mg | INTRAVENOUS | Status: DC | PRN
  Administered 2013-02-28: 20 ug/min via INTRAVENOUS

## 2013-02-28 MED ORDER — PROPOFOL 10 MG/ML IV BOLUS
INTRAVENOUS | Status: DC | PRN
  Administered 2013-02-28 (×2): 30 mg via INTRAVENOUS
  Administered 2013-02-28: 200 mg via INTRAVENOUS

## 2013-02-28 MED ORDER — PHENOL 1.4 % MT LIQD: 1.0000 | OROMUCOSAL | Status: DC | PRN

## 2013-02-28 MED ORDER — CEFAZOLIN SODIUM 1-5 GM-% IV SOLN
1.0000 g | Freq: Four times a day (QID) | INTRAVENOUS | Status: AC
  Administered 2013-02-28 – 2013-03-01 (×3): 1 g via INTRAVENOUS
  Filled 2013-02-28 (×3): qty 50

## 2013-02-28 MED ORDER — PHENYLEPHRINE HCL 10 MG/ML IJ SOLN
INTRAMUSCULAR | Status: DC | PRN
  Administered 2013-02-28: 120 ug via INTRAVENOUS
  Administered 2013-02-28 (×2): 80 ug via INTRAVENOUS
  Administered 2013-02-28: 120 ug via INTRAVENOUS

## 2013-02-28 MED ORDER — ONDANSETRON HCL 4 MG PO TABS: 4.0000 mg | ORAL_TABLET | Freq: Four times a day (QID) | ORAL | Status: DC | PRN

## 2013-02-28 SURGICAL SUPPLY — 60 items
BLADE SAW SGTL 83.5X18.5 (BLADE) ×2 IMPLANT
CEMENT BONE DEPUY (Cement) ×1 IMPLANT
CLOTH BEACON ORANGE TIMEOUT ST (SAFETY) ×2 IMPLANT
COVER SURGICAL LIGHT HANDLE (MISCELLANEOUS) ×2 IMPLANT
DRAPE INCISE IOBAN 66X45 STRL (DRAPES) ×2 IMPLANT
DRAPE SURG 17X11 SM STRL (DRAPES) ×2 IMPLANT
DRAPE SURG 17X23 STRL (DRAPES) ×2 IMPLANT
DRAPE U-SHAPE 47X51 STRL (DRAPES) ×2 IMPLANT
DRILL BIT 7/64X5 (BIT) IMPLANT
DRSG AQUACEL AG ADV 3.5X10 (GAUZE/BANDAGES/DRESSINGS) ×2 IMPLANT
DRSG MEPILEX BORDER 4X4 (GAUZE/BANDAGES/DRESSINGS) ×1 IMPLANT
DRSG MEPILEX BORDER 4X8 (GAUZE/BANDAGES/DRESSINGS) ×2 IMPLANT
DURAPREP 26ML APPLICATOR (WOUND CARE) ×4 IMPLANT
ELECT BLADE 4.0 EZ CLEAN MEGAD (MISCELLANEOUS) ×2
ELECT CAUTERY BLADE 6.4 (BLADE) ×2 IMPLANT
ELECT REM PT RETURN 9FT ADLT (ELECTROSURGICAL) ×2
ELECTRODE BLDE 4.0 EZ CLN MEGD (MISCELLANEOUS) ×1 IMPLANT
ELECTRODE REM PT RTRN 9FT ADLT (ELECTROSURGICAL) ×1 IMPLANT
FACESHIELD LNG OPTICON STERILE (SAFETY) ×6 IMPLANT
GLENOID ANCHOR PEG CROSSLK 44 (Orthopedic Implant) ×1 IMPLANT
GLOVE BIO SURGEON STRL SZ7.5 (GLOVE) ×2 IMPLANT
GLOVE BIO SURGEON STRL SZ8 (GLOVE) ×2 IMPLANT
GLOVE EUDERMIC 7 POWDERFREE (GLOVE) ×2 IMPLANT
GLOVE SS BIOGEL STRL SZ 7.5 (GLOVE) ×1 IMPLANT
GLOVE SUPERSENSE BIOGEL SZ 7.5 (GLOVE) ×1
GOWN STRL NON-REIN LRG LVL3 (GOWN DISPOSABLE) ×2 IMPLANT
GOWN STRL REIN XL XLG (GOWN DISPOSABLE) ×4 IMPLANT
HEAD HUM ECCENTRIC 44X18 STRL (Trauma) ×1 IMPLANT
HUMERAL STEM 14MM (Trauma) ×1 IMPLANT
KIT BASIN OR (CUSTOM PROCEDURE TRAY) ×2 IMPLANT
KIT ROOM TURNOVER OR (KITS) ×2 IMPLANT
MANIFOLD NEPTUNE II (INSTRUMENTS) ×2 IMPLANT
NDL HYPO 25GX1X1/2 BEV (NEEDLE) IMPLANT
NDL SUT 6 .5 CRC .975X.05 MAYO (NEEDLE) ×1 IMPLANT
NEEDLE HYPO 25GX1X1/2 BEV (NEEDLE) IMPLANT
NEEDLE MAYO TAPER (NEEDLE) ×2
NS IRRIG 1000ML POUR BTL (IV SOLUTION) ×2 IMPLANT
PACK SHOULDER (CUSTOM PROCEDURE TRAY) ×2 IMPLANT
PAD ARMBOARD 7.5X6 YLW CONV (MISCELLANEOUS) ×4 IMPLANT
PASSER SUT SWANSON 36MM LOOP (INSTRUMENTS) ×1 IMPLANT
SLING ARM FOAM STRAP LRG (SOFTGOODS) IMPLANT
SLING ARM FOAM STRAP XLG (SOFTGOODS) ×2 IMPLANT
SMARTMIX MINI TOWER (MISCELLANEOUS) ×2
SPONGE LAP 18X18 X RAY DECT (DISPOSABLE) ×2 IMPLANT
SPONGE LAP 4X18 X RAY DECT (DISPOSABLE) ×2 IMPLANT
STRIP CLOSURE SKIN 1/2X4 (GAUZE/BANDAGES/DRESSINGS) ×2 IMPLANT
SUCTION FRAZIER TIP 10 FR DISP (SUCTIONS) ×2 IMPLANT
SUT BONE WAX W31G (SUTURE) ×1 IMPLANT
SUT FIBERWIRE #2 38 T-5 BLUE (SUTURE) ×8
SUT MNCRL AB 3-0 PS2 18 (SUTURE) ×2 IMPLANT
SUT VIC AB 1 CT1 27 (SUTURE) ×6
SUT VIC AB 1 CT1 27XBRD ANBCTR (SUTURE) ×3 IMPLANT
SUT VIC AB 2-0 CT1 27 (SUTURE) ×4
SUT VIC AB 2-0 CT1 TAPERPNT 27 (SUTURE) ×2 IMPLANT
SUTURE FIBERWR #2 38 T-5 BLUE (SUTURE) ×2 IMPLANT
SYR CONTROL 10ML LL (SYRINGE) IMPLANT
TOWEL OR 17X24 6PK STRL BLUE (TOWEL DISPOSABLE) ×2 IMPLANT
TOWEL OR 17X26 10 PK STRL BLUE (TOWEL DISPOSABLE) ×2 IMPLANT
TOWER SMARTMIX MINI (MISCELLANEOUS) ×1 IMPLANT
WATER STERILE IRR 1000ML POUR (IV SOLUTION) ×2 IMPLANT

## 2013-02-28 NOTE — Anesthesia Postprocedure Evaluation (Signed)
Anesthesia Post Note  Patient: Felicia Wright  Procedure(s) Performed: Procedure(s) (LRB): RIGHT TOTAL SHOULDER ARTHROPLASTY (Right)  Anesthesia type: general  Patient location: PACU  Post pain: Pain level controlled  Post assessment: Patient's Cardiovascular Status Stable  Last Vitals:  Filed Vitals:   02/28/13 1123  BP: 111/62  Pulse: 64  Temp: 36.5 C  Resp: 20    Post vital signs: Reviewed and stable  Level of consciousness: sedated  Complications: No apparent anesthesia complications

## 2013-02-28 NOTE — Anesthesia Procedure Notes (Addendum)
Anesthesia Regional Block:  Interscalene brachial plexus block  Pre-Anesthetic Checklist: ,, timeout performed, Correct Patient, Correct Site, Correct Laterality, Correct Procedure, Correct Position, site marked, Risks and benefits discussed,  Surgical consent,  Pre-op evaluation,  At surgeon's request and post-op pain management  Laterality: Right  Prep: chloraprep       Needles:  Injection technique: Single-shot  Needle Type: Echogenic Stimulator Needle     Needle Length: 5cm 5 cm Needle Gauge: 22 and 22 G    Additional Needles:  Procedures: nerve stimulator Interscalene brachial plexus block  Nerve Stimulator or Paresthesia:  Response: 0.48 mA,   Additional Responses:   Narrative:  Start time: 02/28/2013 7:15 AM End time: 02/28/2013 7:24 AM Injection made incrementally with aspirations every 5 mL. Anesthesiologist: Dr Gypsy Balsam  Additional Notes: [id number] R ISB POP CHG prep, sterile tech #22 stim/echo needle w/stim down to .48ma Multiple neg asp Marc .5% w/epi 1:200000 total 25cc+decadron 4mg  infiltrated No compl Dr Gypsy Balsam   Procedure Name: Intubation Date/Time: 02/28/2013 7:46 AM Performed by: Jefm Miles E Pre-anesthesia Checklist: Patient identified, Timeout performed, Emergency Drugs available, Suction available and Patient being monitored Patient Re-evaluated:Patient Re-evaluated prior to inductionOxygen Delivery Method: Circle system utilized Preoxygenation: Pre-oxygenation with 100% oxygen Intubation Type: IV induction and Rapid sequence Ventilation: Mask ventilation without difficulty Laryngoscope Size: Mac and 3 Grade View: Grade I Tube type: Oral Tube size: 7.0 mm Number of attempts: 1 Airway Equipment and Method: Stylet Placement Confirmation: ETT inserted through vocal cords under direct vision,  breath sounds checked- equal and bilateral and positive ETCO2 Secured at: 21 cm Tube secured with: Tape Dental Injury: Teeth and Oropharynx as per  pre-operative assessment

## 2013-02-28 NOTE — Progress Notes (Signed)
Placed pt. On CPAP of 12cm H2O via FFM (what pt. Wears at home) Pt. Is tolerating CPAP well at this time. Pt. Was made aware to call RT if she had any problems with CPAP.

## 2013-02-28 NOTE — Progress Notes (Signed)
UR COMPLETED  

## 2013-02-28 NOTE — Preoperative (Signed)
Beta Blockers   Reason not to administer Beta Blockers:Not Applicable 

## 2013-02-28 NOTE — Anesthesia Preprocedure Evaluation (Addendum)
Anesthesia Evaluation  Patient identified by MRN, date of birth, ID band Patient awake    Reviewed: Allergy & Precautions, H&P , NPO status , Patient's Chart, lab work & pertinent test results  Airway Mallampati: II TM Distance: >3 FB Neck ROM: Full    Dental  (+) Edentulous Upper, Edentulous Lower and Dental Advisory Given   Pulmonary asthma , sleep apnea and Continuous Positive Airway Pressure Ventilation ,  breath sounds clear to auscultation        Cardiovascular hypertension, Pt. on medications + dysrhythmias Rhythm:Regular Rate:Normal     Neuro/Psych PSYCHIATRIC DISORDERS Depression  Neuromuscular disease    GI/Hepatic GERD-  Medicated and Controlled,  Endo/Other  diabetes, Well ControlledMorbid obesity  Renal/GU      Musculoskeletal   Abdominal (+) + obese,   Peds  Hematology   Anesthesia Other Findings   Reproductive/Obstetrics                          Anesthesia Physical Anesthesia Plan  ASA: III  Anesthesia Plan: General   Post-op Pain Management:    Induction: Intravenous  Airway Management Planned: Oral ETT  Additional Equipment:   Intra-op Plan:   Post-operative Plan: Extubation in OR  Informed Consent: I have reviewed the patients History and Physical, chart, labs and discussed the procedure including the risks, benefits and alternatives for the proposed anesthesia with the patient or authorized representative who has indicated his/her understanding and acceptance.     Plan Discussed with: CRNA and Surgeon  Anesthesia Plan Comments:         Anesthesia Quick Evaluation

## 2013-02-28 NOTE — Op Note (Signed)
02/28/2013  9:57 AM  PATIENT:   Felicia Wright  62 y.o. female  PRE-OPERATIVE DIAGNOSIS:  Osteoarthitis of the Right Shoudler  POST-OPERATIVE DIAGNOSIS:  same  PROCEDURE:  R TSA #14 stem, 44X18 eccentric head, 44 APG  SURGEON:  Aja Bolander, Vania Rea. M.D.  ASSISTANTS: Shuford pac   ANESTHESIA:   GET + ISB  EBL: 150  SPECIMEN:  none  Drains: none   PATIENT DISPOSITION:  PACU - hemodynamically stable.    PLAN OF CARE: Admit to inpatient   Dictation# [id number]

## 2013-02-28 NOTE — Transfer of Care (Signed)
Immediate Anesthesia Transfer of Care Note  Patient: Felicia Wright  Procedure(s) Performed: Procedure(s): RIGHT TOTAL SHOULDER ARTHROPLASTY (Right)  Patient Location: PACU  Anesthesia Type:General  Level of Consciousness: awake, alert  and oriented  Airway & Oxygen Therapy: Patient Spontanous Breathing and Patient connected to nasal cannula oxygen  Post-op Assessment: Report given to PACU RN  Post vital signs: Reviewed and stable  Complications: No apparent anesthesia complications

## 2013-02-28 NOTE — Op Note (Signed)
NAMEJAQUELYN, SAKAMOTO NO.:  [id number]  MEDICAL RECORD NO.:  [id number]  LOCATION:  5N21C                        FACILITY:  [facility]  PHYSICIAN:  Vania Rea. Natoya Viscomi, M.D.  DATE OF BIRTH:  [birth year]  DATE OF PROCEDURE:  02/28/2013 DATE OF DISCHARGE:                              OPERATIVE REPORT   PREOPERATIVE DIAGNOSIS:  End-stage right shoulder osteoarthritis.  POSTOPERATIVE DIAGNOSIS:  End-stage right shoulder osteoarthritis.  PROCEDURE:  Right total shoulder arthroplasty utilizing a press-fit size 14 DePuy global stem with a 44 x 18 eccentric humeral head and a cemented pegged 44 glenoid.  SURGEON:  Vania Rea. Masayuki Sakai, M.D.  Threasa HeadsFrench Ana A. Shuford, P.A.-C.  ANESTHESIA:  General endotracheal as well as an interscalene block.  ESTIMATED BLOOD LOSS:  150 mL.  DRAINS:  None.  HISTORY:  Ms. Frankland is a 62 year old female who has had chronic and progressive increasing right shoulder pain with functional limitations related to end-stage glenohumeral arthrosis.  Her symptoms have been refractory to prolonged attempts at conservative management.  Plain radiographs showed marked deformity of the humeral head with complete obliteration of the joint space.  She was brought to the operating room at this time for planned right total shoulder arthroplasty.  Preoperatively, I counseled Ms. Jurney on treatment options as well as risks versus benefits thereof.  Possible surgical complications are reviewed including potential for bleeding, infection, neurovascular injury, persistent pain, loss of motion, anesthetic complication, failure of implant, possible need for revision surgery.  She understands and accepts and agrees with our planned procedure.  PROCEDURE IN DETAIL:  After undergoing routine preop evaluation, the patient received prophylactic antibiotics and interscalene block was established in the holding area by the Anesthesia Department.  Placed supine on  the operative table, underwent smooth induction of a general endotracheal anesthesia.  Placed in beach-chair position and appropriately padded and protected.  The right shoulder girdle region was sterilely prepped and draped in standard fashion.  Time-out was called.  An anterior approach to the right shoulder was made through an approximately 20 cm anterior incision along the deltopectoral interval. Skin flaps were elevated.  Electrocautery was used for hemostasis. Dissection carried deeply.  Coracoid process identified.  Deltopectoral interval was identified with cephalic vein retracted laterally and with the deltoid, and this interval was then developed distally the upper 1.5 cm of the pectoralis major was tenotomized to enhance visualization. Self-retaining retractors were placed.  Adhesions in the subacromial/subdeltoid bursa were released.  The conjoined tendon was identified, mobilized, and retracted medially.  The anterior humeral circumflex vessels were electrocoagulated.  The biceps tendon was released from bicipital groove and then tenotomized for later tenodesis. Subscapularis was then divided from its insertion to the lesser tuberosity leaving a lateral 1 cm to 1.5 cm cuff of tissue for later repair and also split the rotator intervals at the base of the coracoid. The subscapular was intact,  reflected medially, and mobilized.  We then divided the capsule from the anterior-inferior and posterior-inferior aspects of the humeral head.  Delivered the head through the wound and carefully protected the rotator cuff superiorly and posteriorly.  There were very prominent osteophytes anteriorly and inferiorly.  Using an extramedullary  guide, we then resected the humeral head protecting the rotator cuff and maintaining approximately 30 degrees of retroversion. We then used a combination of rongeur and osteotome to remove the prominent osteophytes from the margin of the humeral head.   We then performed hand reaming of the humeral canal up to size 14.  The cookie cutter broach was then placed and we broached up to size 14.  Once this was completed, we placed a metal cap over the cut surface of the proximal humerus and exposed the glenoid with [place] pitchfork and snake tongue retractors.  We performed a circumferential labral resection removing the proximal stump of the biceps and performed a circumferential capsular release to gain full exposure of the glenoid. There was moderate retroversion noted.  A guide pin was then placed at the center of the glenoid and using a 44 reamer, we reamed the glenoid to a stable subchondral surface correcting the retroversion to neutral alignment.  We then placed a central drill hole and peripheral peg holes and trial 44 glenoid showed excellent fit.  Then, it was irrigated, all debris was removed.  It was dried, cement was mixed at the appropriate consistency.  Cement was mixed into the peripheral peg holes and the final 44 glenoid was impacted into position with excellent fit and fixation.  Once the cement was allowed to harden, we returned our attention to the proximal humerus where the humeral stem was impacted into the humeral canal with excellent interference fit and fixation on the press-fit construct.  We then performed sizings of the humeral head. The trial reductions and the 44 x 18 eccentric had  the best fit and soft tissue balance.  We then cleaned the [facility] taper before a final 44 x 18 eccentric head was impacted into position.  Final reduction was performed.  Overall position, soft tissue balance, and stability was much to our satisfaction.  Wound was irrigated.  Hemostasis was obtained.  The subscapularis was then repaired back to the lesser tuberosity with #2 FiberWires.  The rotator interval was closed with repair of high-grade #2 FiberWire sutures.  We then performed a tenodesis of the biceps.  The upper margin of  the subscapularis insertion using #2 FiberWires.  Remaining proximal portion of biceps was excised.  The wound was then terminally irrigated.  Hemostasis was obtained.  The deltopectoral interval was then reapproximated with 0 Vicryl. Vicryl 2-0 used for the subcutaneous layer and intracuticular 3- 0 Monocryl for the skin, followed by Steri-Strips.  Dry dressing was applied.  Ralene Bathe, P.A.-C., was used as an Geophysicist/field seismologist throughout this case essential for help with positioning the extremity, management of the retractors, exposure, implantation of the prosthesis and particularly the consideration due to the patient's morbid obesity, and a very large body habitus so help was essential.  The right upper extremity was then placed into a sling.  The patient was awakened, extubated, and taken recovery room in stable condition.     Vania Rea. Nima Bamburg, M.D.     KMS/MEDQ  D:  02/28/2013  T:  02/28/2013  Job:  [id number]

## 2013-02-28 NOTE — H&P (Signed)
[facility]    Chief Complaint: Osteoarthitis of the Right Shoudler HPI: The patient is a 62 y.o. female with end stage right shoulder OA  Past Medical History  Diagnosis Date  . Normocytic anemia   . Malaise and fatigue   . Oral candidiasis   . Contusion of second toe, right   . Cystitis   . Accidental fall   . Aphthous ulcer   . Hip pain, left   . Preventative health care   . Encounter for long-term (current) use of other medications   . Reactive depression (situational)   . Morbid obesity   . Sleep apnea   . Fibroid uterus   . Cataract   . Arthritis   . Incontinence   . Constipation   . IBS (irritable bowel syndrome)   . Bronchitis   . Abnormal heart rhythm   . Seizure disorder   . Hypertension   . Hyperlipidemia   . GERD (gastroesophageal reflux disease)   . Depression   . Asthma   . Allergic rhinitis   . Diabetes mellitus type II     diet controlled  . Ulnar neuropathy     Left  . Carpal tunnel syndrome   . Overactive bladder     Past Surgical History  Procedure Laterality Date  . Nasal sinus surgery  1983  . Tonsillectomy    . Hemorroidectomy    . Heel spur surgery      Right  . Carpal tunnel release      Right  . Cholecystectomy    . Knee arthroscopy      Right  . Dilation and curettage of uterus    . Breast biopsy  03/07/2012    Procedure: BREAST BIOPSY WITH NEEDLE LOCALIZATION;  Surgeon: Shelly Rubenstein, MD;  Location: [facility];  Service: General;  Laterality: Right;  needle localized right breast lumpectomy    Family History  Problem Relation Age of Onset  . Hypertension Mother   . Heart failure Mother   . Fibroids Sister   . Hypertension Brother   . Diabetes Brother   . Arthritis Other   . Coronary artery disease Other   . Asthma Other     Social History:  reports that she quit smoking about 14 years ago. She does not have any smokeless tobacco history on file. She reports that she does not drink alcohol or use  illicit drugs.  Allergies:  Allergies  Allergen Reactions  . Sertraline Hcl     Lips swelling & loose stools  . Peach (Prunus Persica)   . Strawberry     Medications Prior to Admission  Medication Sig Dispense Refill  . acetaminophen (TYLENOL) 500 MG tablet Take 1,000 mg by mouth at bedtime as needed for pain.       Marland Kitchen aspirin 81 MG tablet Take 162 mg by mouth at bedtime.       . celecoxib (CELEBREX) 200 MG capsule Take 200 mg by mouth every morning.       . Cholecalciferol (VITAMIN D3) 2000 UNITS capsule Take 2,000 Units by mouth every morning.      . diclofenac (VOLTAREN) 0.1 % ophthalmic solution Place 1 drop into both eyes at bedtime as needed (eye irritation).       . diphenhydrAMINE (BENADRYL) 25 mg capsule Take 50 mg by mouth at bedtime as needed for allergies or sleep.       Marland Kitchen glucosamine-chondroitin 500-400 MG tablet Take 1 tablet by mouth 2 (  two) times daily.       Marland Kitchen lisinopril (PRINIVIL,ZESTRIL) 20 MG tablet Take 20 mg by mouth every morning.       . Multiple Vitamin (MULTIVITAMIN WITH MINERALS) TABS Take 1 tablet by mouth daily.      Marland Kitchen omeprazole (PRILOSEC) 20 MG capsule Take 20 mg by mouth daily before breakfast.       . pravastatin (PRAVACHOL) 80 MG tablet Take 80 mg by mouth at bedtime.       Marland Kitchen zinc gluconate 50 MG tablet Take 50 mg by mouth daily.         Physical Exam: right shoulder with painful and restricted motion as noted at recent office visit  Vitals  Temp:  [98.3 F (36.8 C)] 98.3 F (36.8 C) (04/24 0549) Pulse Rate:  [92] 92 (04/24 0549) Resp:  [20] 20 (04/24 0549) BP: (157)/(75) 157/75 mmHg (04/24 0549) SpO2:  [97 %] 97 % (04/24 0549)  Assessment/Plan  Impression: Osteoarthitis of the Right Shoudler  Plan of Action: Procedure(s): RIGHT TOTAL SHOULDER ARTHROPLASTY  Janai Maudlin M 02/28/2013, 7:31 AM

## 2013-03-01 ENCOUNTER — Encounter (HOSPITAL_COMMUNITY): Payer: Self-pay | Admitting: Orthopedic Surgery

## 2013-03-01 LAB — GLUCOSE, CAPILLARY
Glucose-Capillary: 74 mg/dL (ref 70–99)
Glucose-Capillary: 78 mg/dL (ref 70–99)

## 2013-03-01 NOTE — Care Management Note (Signed)
CARE MANAGEMENT NOTE 03/01/2013  Patient:  Felicia Wright,Felicia Wright   Account Number:  [id number]  Date Initiated:  03/01/2013  Documentation initiated by:  Vance Peper  Subjective/Objective Assessment:   62 yr old female s/p right total shoulder arthroplasty.     Action/Plan:   CM spoke with patient concerning home health needs. Choice offered. patient's brother will also assist with care in the evening.   Anticipated DC Date:  03/01/2013   Anticipated DC Plan:  HOME W HOME HEALTH SERVICES      DC Planning Services  CM consult      [facility] Choice  HOME HEALTH   Choice offered to / List presented to:  C-1 Patient        HH arranged  HH-2 PT  HH-3 OT  HH-4 NURSE'S AIDE      HH agency  [facility]   Status of service:  Completed, signed off Medicare Important Message given?   (If response is "NO", the following Medicare IM given date fields will be blank) Date Medicare IM given:   Date Additional Medicare IM given:    Discharge Disposition:  HOME W HOME HEALTH SERVICES  Per UR Regulation:    If discussed at Long Length of Stay Meetings, dates discussed:    Comments:

## 2013-03-01 NOTE — Progress Notes (Signed)
Felicia Wright  MRN: [id number] DOB/Age: [birth year] 63 y.o. Physician: Lynnea Maizes, M.D. 1 Day Post-Op Procedure(s) (LRB): RIGHT TOTAL SHOULDER ARTHROPLASTY (Right)  Subjective: Slept well last night, reports minimal pain Vital Signs Temp:  [97.5 F (36.4 C)-98.6 F (37 C)] 98.6 F (37 C) (04/25 0517) Pulse Rate:  [58-81] 58 (04/25 0517) Resp:  [16-22] 18 (04/25 0517) BP: (111-140)/(46-76) 114/46 mmHg (04/25 0517) SpO2:  [95 %-99 %] 95 % (04/25 0517)  Lab Results No results found for this basename: WBC, HGB, HCT, PLT,  in the last 72 hours BMET No results found for this basename: NA, K, CL, CO2, GLUCOSE, BUN, CREATININE, CALCIUM,  in the last 72 hours INR  Date Value Range Status  06/19/2009 1.1  0.00 - 1.49 Final     Exam  Incision clean, dressing dry, N/V intact. Good mobility  Plan OT today, HHPT,OT, D/C home, f/u 2 weeks  Babbie Dondlinger M 03/01/2013, 9:42 AM

## 2013-03-01 NOTE — Evaluation (Addendum)
Occupational Therapy Evaluation Patient Details Name: Felicia Wright MRN: [id number] DOB: 1951-07-30 Today's Date: 03/01/2013 Time: 8119-1478 OT Time Calculation (min): 53 min  OT Assessment / Plan / Recommendation Clinical Impression   62 y.o. S/p Rt. TSA. Pt and pt's brother educated on exercises and shoulder handout prior to d/c. Spoke to brother about coming to her house 2-3 times per day to do PROM exercises with pt and to assist as needed and he was agreeable. Vc's for pt not to move Rt shoulder. Recommend HHOT upon d/c.      OT Assessment  All further OT needs can be met in the next venue of care    Follow Up Recommendations  Home health OT;Supervision - Intermittent    Barriers to Discharge      Equipment Recommendations       Recommendations for Other Services    Frequency       Precautions / Restrictions Precautions Precautions: Shoulder Type of Shoulder Precautions: Supple TSA Protocol- Limit ER to 20 degrees Precaution Booklet Issued: Yes (comment) Required Braces or Orthoses: Other Brace/Splint (shoulder sling) Restrictions Weight Bearing Restrictions: Yes RUE Weight Bearing: Non weight bearing   Pertinent Vitals/Pain Pain 3/10. Repositioned.     ADL  Toilet Transfer: Radiographer, therapeutic Method: Sit to Barista: Raised toilet seat with arms (or 3-in-1 over toilet) Equipment Used: Other (comment) (shoulder sling, straight cane)    OT Diagnosis:    OT Problem List:   OT Treatment Interventions:     OT Goals    Visit Information  Last OT Received On: 03/01/13 Assistance Needed: +1    Subjective Data      Prior Functioning     Home Living Lives With: Alone Available Help at Discharge: Family;Neighbor;Available PRN/intermittently Bathroom Shower/Tub: Tub/shower unit (reports she is going to sponge bathe) Bathroom Toilet: Standard (has 3 in 1) Home Adaptive Equipment: Bedside commode/3-in-1;Straight  cane Communication Communication: No difficulties         Vision/Perception     Cognition  Cognition Arousal/Alertness: Awake/alert Behavior During Therapy: WFL for tasks assessed/performed Overall Cognitive Status: Within Functional Limits for tasks assessed    Extremity/Trunk Assessment Right Upper Extremity Assessment RUE ROM/Strength/Tone: Deficits;Due to pain;Due to precautions Left Upper Extremity Assessment LUE ROM/Strength/Tone: [facility] for tasks assessed     Mobility Bed Mobility Bed Mobility: Supine to Sit;Sitting - Scoot to Edge of Bed Supine to Sit: 5: Supervision;With rails Sitting - Scoot to Edge of Bed: 5: Supervision Details for Bed Mobility Assistance: Pt had to use handrails to help from supine to sit Transfers Transfers: Sit to Stand;Stand to Sit Sit to Stand: 5: Supervision;With upper extremity assist;From chair/3-in-1;From bed Stand to Sit: 5: Supervision;With upper extremity assist;To bed;To chair/3-in-1     Exercise Shoulder Exercises Pendulum Exercise: Right;10 reps;Standing Shoulder Flexion: PROM;Right;10 reps;Supine Shoulder ABduction: PROM;Right;10 reps;Supine Shoulder External Rotation: PROM;Right;10 reps;Supine Elbow Flexion: AROM;Right;10 reps;Standing Elbow Extension: AROM;Right;10 reps;Standing Wrist Flexion: AROM;Right;10 reps;Standing Wrist Extension: AROM;Right;10 reps;Standing Digit Composite Flexion: AROM;Right;10 reps;Standing Composite Extension: AROM;Right;10 reps;Standing Neck Flexion: AROM;10 reps;Standing Neck Extension: AROM;10 reps;Standing Neck Lateral Flexion - Right: AROM;10 reps;Standing Neck Lateral Flexion - Left: AROM;10 reps;Standing Donning/doffing shirt without moving shoulder: Patient able to independently direct caregiver Method for sponge bathing under operated UE: Patient able to independently direct caregiver Donning/doffing sling/immobilizer: Patient able to independently direct caregiver Correct positioning  of sling/immobilizer: Patient able to independently direct caregiver Pendulum exercises (written home exercise program): Supervision/safety ROM for elbow, wrist and digits of operated UE: Supervision/safety  Sling wearing schedule (on at all times/off for ADL's): Patient able to independently direct caregiver Proper positioning of operated UE when showering: Supervision/safety Positioning of UE while sleeping: Patient able to independently direct caregiver   Balance     End of Session OT - End of Session Equipment Utilized During Treatment: Other (comment) (shoulder sling, straight cane) Activity Tolerance: Patient tolerated treatment well Patient left: in chair;with family/visitor present  GO     Earlie Raveling OTR/L [phone] 03/01/2013, 12:22 PM

## 2013-03-01 NOTE — Progress Notes (Signed)
Patient completed OT before discharge. Discharge instructions and prescriptions reviewed with patient. All questions answered. Patient wheeled down stairs with volunteer services to brothers car.

## 2013-03-19 ENCOUNTER — Encounter (HOSPITAL_COMMUNITY): Payer: Self-pay | Admitting: Dietician

## 2013-03-19 NOTE — Progress Notes (Signed)
Follow-Up Outpatient Nutrition Note Date: 03/19/2013  Appt Start Time: 1014  Nutrition Assessment:  Current weight: Weight: 268 lb (121.564 kg)  BMI: Body mass index is 43.28 kg/(m^2).  Weight changes: -2# (0.7%) x 1 month  Felicia Wright is delighted that she has lost some weight, however, she reports, "I thought I gained weight; they're making me eat more". Pt s/p shoulder sx on 02/28/13, and is receiving home PT, OT, and nursing services. SHe reports that she will hopefully be able to transition to [facility] outpatient [facility]hab in about 2-3 months. She is currently receiving therapy 2-3 days per week.  Her borther, Felicia Wright, who is her primary caregiver during this time, is present with her today. He is assisting with her meals. Pt is now eating around 1300 kcals daily, as opposed to 900 kcals daily at previous appointment. She reports she also wants to eat more, as she is not as active. Noted that pt has added an afternoon snack. She reports craving sweets more.   CBGS slightly higher than normal- ranging from 92-160.  Labs: CMP     Component Value Date/Time   NA 132* 02/22/2013 1145   K 4.2 02/22/2013 1145   CL 98 02/22/2013 1145   CO2 26 02/22/2013 1145   GLUCOSE 86 02/22/2013 1145   BUN 20 02/22/2013 1145   CREATININE 0.85 02/22/2013 1145   CALCIUM 10.0 02/22/2013 1145   PROT 6.9 06/19/2009 1256   ALBUMIN 4.3 06/19/2009 1256   AST 20 06/19/2009 1256   ALT 12 06/19/2009 1256   ALKPHOS 62 06/19/2009 1256   BILITOT 0.9 06/19/2009 1256   GFRNONAA 71* 02/22/2013 1145   GFRAA 83* 02/22/2013 1145    Lipid Panel     Component Value Date/Time   CHOL 144 06/03/2009 2311   TRIG 96 06/03/2009 2311   HDL 61 06/03/2009 2311   CHOLHDL 2.4 Ratio 06/03/2009 2311   VLDL 19 06/03/2009 2311   LDLCALC 64 06/03/2009 2311     Lab Results  Component Value Date   HGBA1C 5.7 07/22/2009   HGBA1C 5.8 06/03/2009   HGBA1C 5.4 03/04/2009   Lab Results  Component Value Date   MICROALBUR 0.21 11/13/2008   LDLCALC 64 06/03/2009   CREATININE 0.85 02/22/2013     Diet recall: Breakfast: 1 scoop of raw fit powder, 2 cups Activia Light, 2 small clementines; Lunch: 1/2 cup tomatoes, 1/2 cup cucumbers, 2 tbs peanut butter, 2 rice cakes, 2 tbs FF ranch, 1 cup water melon; Snack: small box of raisins, unsalted, roasted almonds; Dinner: 2 baked chicken pieces, 1 cup of green beans and onions, 2 small potatoes, 1 cup of water melon. Beverages consist of water only.   Nutrition Diagnosis: Involuntary weight gain r/t physical inactivity AEB 27# wt gain x 4 years.   Nutrition Intervention: Nutrition rx: 1500 kcal NAS, diabetic diet; 3 meals per day (45-60 grams carbohydrate per meal); limit snacks; low calorie beverages only; 30 minutes physical activity 5 times per week  Education/ counseling provided: Closely reviewed food diary with patient and discussed ways to help her achieve a healthier lifestyle. Reemphasized dangers of a hypocaloric diet and emphasized importance of consuming at least 1200 kcals daily, in order to support and maintain healthy weight loss as well as support adequate nutrition. Reviewed portion sizes and discussed ideas for meals and snacks. Discussed importance of adhering to therapy recommendations. Reemphasized principles of healthy diet and keeping food diary. Discussed pros and cons of fad diets. Emphasized importance of making practical changes  that pt will be able to follow long term to maintain weight loss. Provided emotional support. Praised pt for progress made.  Understanding/Motivation/ Ability to follow recommendations: Expect fair to good compliance.   Monitoring and Evaluation: Previous Goals: 1) 1-2# wt loss per week- progressing; 2) 3 meals per day- progressing; 3) 30 minutess physical activity 5 times per week- progressing  Goals for next visit: 1) 1-2# wt loss per week; 2) 3 meals per day; 3) 30 minutes physical activity 5 times per week  Recommendations: 1) Consume no less than 1200 kcals  daily; 2) Stay within confines of physical activity limitations per therapy;   F/U: 4-6 weeks. F/U scheduled for Tuesday, 04/16/13 at 1000.   Melody Haver, RD, LDN Date:03/19/2013 Appt EndTime: 1115

## 2013-03-21 NOTE — Discharge Summary (Signed)
PATIENT ID:      Felicia Wright  MRN:     [id number] DOB/AGE:    August 23, 1951 / 62 y.o.     DISCHARGE SUMMARY  ADMISSION DATE:    02/28/2013 DISCHARGE DATE:  03/01/2013  ADMISSION DIAGNOSIS: Osteoarthitis of the Right Shoudler Past Medical History  Diagnosis Date  . Normocytic anemia   . Malaise and fatigue   . Oral candidiasis   . Contusion of second toe, right   . Cystitis   . Accidental fall   . Aphthous ulcer   . Hip pain, left   . Preventative health care   . Encounter for long-term (current) use of other medications   . Reactive depression (situational)   . Morbid obesity   . Sleep apnea   . Fibroid uterus   . Cataract   . Arthritis   . Incontinence   . Constipation   . IBS (irritable bowel syndrome)   . Bronchitis   . Abnormal heart rhythm   . Seizure disorder   . Hypertension   . Hyperlipidemia   . GERD (gastroesophageal reflux disease)   . Depression   . Asthma   . Allergic rhinitis   . Diabetes mellitus type II     diet controlled  . Ulnar neuropathy     Left  . Carpal tunnel syndrome   . Overactive bladder     DISCHARGE DIAGNOSIS:   Active Problems:   Shoulder arthritis   PROCEDURE: Procedure(s): RIGHT TOTAL SHOULDER ARTHROPLASTY on 02/28/2013  CONSULTS:     HISTORY:  See H&P in chart.  HOSPITAL COURSE:  Tressy Kunzman is a 62 y.o. admitted on 02/28/2013 with a chief complaint of No chief complaint on file. , and found to have a diagnosis of Osteoarthitis of the Right Shoudler.  They were brought to the operating room on 02/28/2013 and underwent Procedure(s): RIGHT TOTAL SHOULDER ARTHROPLASTY.    They were given perioperative antibiotics:  Anti-infectives   Start     Dose/Rate Route Frequency Ordered Stop   02/28/13 1400  ceFAZolin (ANCEF) IVPB 1 g/50 mL premix     1 g 100 mL/hr over 30 Minutes Intravenous Every 6 hours 02/28/13 1123 03/01/13 0434   02/28/13 0600  ceFAZolin (ANCEF) 3 g in dextrose 5 % 50 mL IVPB     3 g 160 mL/hr over 30 Minutes  Intravenous On call to O.R. 02/27/13 1517 02/28/13 0750    .  Patient underwent the above named procedure and tolerated it well. The following day they were hemodynamically stable and pain was controlled on oral analgesics. They were neurovascularly intact to the operative extremity. OT was ordered and worked with patient per protocol. They were medically and orthopaedically stable for discharge on 03/01/2013.     DIAGNOSTIC STUDIES:  RECENT RADIOGRAPHIC STUDIES :  Dg Chest 2 View  02/22/2013   *RADIOLOGY REPORT*  Clinical Data: Preop for right shoulder arthroplasty, hypertension, diabetes, former smoking history  CHEST - 2 VIEW  Comparison: Chest x-ray of 06/19/2009  Findings: No active infiltrate or effusion is seen.  Mild cardiomegaly is stable and mediastinal contours appear normal. There are degenerative changes throughout the thoracic spine, and there is advanced degenerative joint disease involving the right shoulder.  IMPRESSION: Stable cardiomegaly.  No active lung disease.  Advanced degenerative change of the right shoulder.   Original Report Authenticated By: Dwyane Dee, M.D.    RECENT VITAL SIGNS:  No data found. Marland Kitchen  RECENT EKG RESULTS:    Orders placed during  the hospital encounter of 03/07/12  . EKG 12-LEAD  . EKG 12-LEAD    DISCHARGE INSTRUCTIONS:    DISCHARGE MEDICATIONS:     Medication List    TAKE these medications       acetaminophen 500 MG tablet  Commonly known as:  TYLENOL  Take 1,000 mg by mouth at bedtime as needed for pain.     aspirin 81 MG tablet  Take 162 mg by mouth at bedtime.     celecoxib 200 MG capsule  Commonly known as:  CELEBREX  Take 200 mg by mouth every morning.     cyclobenzaprine 5 MG tablet  Commonly known as:  FLEXERIL  Take 1 tablet (5 mg total) by mouth 3 (three) times daily as needed for muscle spasms.     diclofenac 0.1 % ophthalmic solution  Commonly known as:  VOLTAREN  Place 1 drop into both eyes at bedtime as needed  (eye irritation).     diphenhydrAMINE 25 mg capsule  Commonly known as:  BENADRYL  Take 50 mg by mouth at bedtime as needed for allergies or sleep.     glucosamine-chondroitin 500-400 MG tablet  Take 1 tablet by mouth 2 (two) times daily.     lisinopril 20 MG tablet  Commonly known as:  PRINIVIL,ZESTRIL  Take 20 mg by mouth every morning.     multivitamin with minerals Tabs  Take 1 tablet by mouth daily.     omeprazole 20 MG capsule  Commonly known as:  PRILOSEC  Take 20 mg by mouth daily before breakfast.     oxyCODONE-acetaminophen 5-325 MG per tablet  Commonly known as:  PERCOCET  Take 1-2 tablets by mouth every 4 (four) hours as needed for pain.     pravastatin 80 MG tablet  Commonly known as:  PRAVACHOL  Take 80 mg by mouth at bedtime.     Vitamin D3 2000 UNITS capsule  Take 2,000 Units by mouth every morning.     zinc gluconate 50 MG tablet  Take 50 mg by mouth daily.        FOLLOW UP VISIT:       Follow-up Information   Follow up with SUPPLE,KEVIN M, MD. (call to be seen in 10-14 days)    Contact information:   [address] [place] [address] [phone]       DISCHARGE BJ:YNWG  DISPOSITION: Good  DISCHARGE CONDITION:  Rodolph Bong for Dr. Francena Hanly 03/21/2013, 3:15 PM

## 2013-04-02 ENCOUNTER — Telehealth (HOSPITAL_COMMUNITY): Payer: Self-pay | Admitting: Dietician

## 2013-04-02 ENCOUNTER — Ambulatory Visit (HOSPITAL_COMMUNITY)
Admission: RE | Admit: 2013-04-02 | Discharge: 2013-04-02 | Disposition: A | Discharge status: Home / Self Care | Payer: Medicare Other | Source: Ambulatory Visit | Attending: Orthopedic Surgery | Admitting: Orthopedic Surgery

## 2013-04-02 DIAGNOSIS — I10 Essential (primary) hypertension: Secondary | ICD-10-CM | POA: Insufficient documentation

## 2013-04-02 DIAGNOSIS — M19019 Primary osteoarthritis, unspecified shoulder: Secondary | ICD-10-CM

## 2013-04-02 DIAGNOSIS — M25619 Stiffness of unspecified shoulder, not elsewhere classified: Secondary | ICD-10-CM | POA: Insufficient documentation

## 2013-04-02 DIAGNOSIS — M25519 Pain in unspecified shoulder: Secondary | ICD-10-CM | POA: Insufficient documentation

## 2013-04-02 DIAGNOSIS — E119 Type 2 diabetes mellitus without complications: Secondary | ICD-10-CM | POA: Insufficient documentation

## 2013-04-02 DIAGNOSIS — IMO0001 Reserved for inherently not codable concepts without codable children: Secondary | ICD-10-CM | POA: Insufficient documentation

## 2013-04-02 DIAGNOSIS — M6281 Muscle weakness (generalized): Secondary | ICD-10-CM | POA: Insufficient documentation

## 2013-04-02 NOTE — Telephone Encounter (Signed)
Received voicemail from pt at 1038. Called back at 1045. Appointment rescheduled for 04/09/13 at 1400.

## 2013-04-02 NOTE — Telephone Encounter (Signed)
Called and left message on pt voicemail at [phone]. Follow-up appointment must be rescheduled due to provider scheduling conflict.

## 2013-04-02 NOTE — Telephone Encounter (Signed)
Encounter opened in error. Please diregard.

## 2013-04-02 NOTE — Evaluation (Signed)
Occupational Therapy Evaluation  Patient Details  Name: Felicia Wright MRN: [id number] Date of Birth: 12/23/1950  Today's Date: 04/02/2013 Time: 1350-1430 OT Time Calculation (min): 40 min OT Evaluation 20' Manual Therapy 20' Visit#: 1 of 24  Re-eval: 04/30/13  Assessment Diagnosis: S/P Right Total Shoulder Replacement Surgical Date: 02/28/13 Next MD Visit: 04/08/13 with Dr. Rennis Chris Prior Therapy: home health OT  Authorization: [employer]  Authorization Time Period: before 10th visit  Authorization Visit#: 1 of 10   Past Medical History:  Past Medical History  Diagnosis Date  . Normocytic anemia   . Malaise and fatigue   . Oral candidiasis   . Contusion of second toe, right   . Cystitis   . Accidental fall   . Aphthous ulcer   . Hip pain, left   . Preventative health care   . Encounter for long-term (current) use of other medications   . Reactive depression (situational)   . Morbid obesity   . Sleep apnea   . Fibroid uterus   . Cataract   . Arthritis   . Incontinence   . Constipation   . IBS (irritable bowel syndrome)   . Bronchitis   . Abnormal heart rhythm   . Seizure disorder   . Hypertension   . Hyperlipidemia   . GERD (gastroesophageal reflux disease)   . Depression   . Asthma   . Allergic rhinitis   . Diabetes mellitus type II     diet controlled  . Ulnar neuropathy     Left  . Carpal tunnel syndrome   . Overactive bladder    Past Surgical History:  Past Surgical History  Procedure Laterality Date  . Nasal sinus surgery  1983  . Tonsillectomy    . Hemorroidectomy    . Heel spur surgery      Right  . Carpal tunnel release      Right  . Cholecystectomy    . Knee arthroscopy      Right  . Dilation and curettage of uterus    . Breast biopsy  03/07/2012    Procedure: BREAST BIOPSY WITH NEEDLE LOCALIZATION;  Surgeon: Shelly Rubenstein, MD;  Location: [facility];  Service: General;  Laterality: Right;  needle localized  right breast lumpectomy  . Total shoulder arthroplasty Right 02/28/2013    Procedure: RIGHT TOTAL SHOULDER ARTHROPLASTY;  Surgeon: Senaida Lange, MD;  Location: [facility] OR;  Service: Orthopedics;  Laterality: Right;    Subjective Symptoms/Limitations Symptoms: S:  I have to tell myself to relax. Pertinent History: [name] fell 2 times in recent months, landing on her right shoulder.  She consulted with Dr. Rennis Chris and had a total shoulder replacement on 02/28/13.  She has been referred to occupational therapy for evaluation and treatment, follwing Dr. Darol Destine protocol for total shoulder replacement.   Limitations: Total Shoulder Replacement Protocol:  Currently 4 weeks post op:  PHASE II 4-10 weeks 03/29/13-05/10/13  PROM progress to AAROM, AAROM of ER/IR in supine 04/05/13.  AROM flexion in supine, seated flexion 45-90, seated abduction.  04/26/13 may add tband for ext rot and int rot.  Special Tests: DASH scored 72 with ideal score being 0.   Patient Stated Goals: I want to drive and take care of myself.  Pain Assessment Currently in Pain?: Yes Pain Score:   4 Pain Location: Shoulder Pain Orientation: Right Pain Type: Acute pain  Precautions/Restrictions  Precautions Precautions: Shoulder  Balance Screening Balance Screen Has the patient fallen in the  past 6 months: Yes How many times?: 2 Has the patient had a decrease in activity level because of a fear of falling? : No Is the patient reluctant to leave their home because of a fear of falling? : No  Prior Functioning  Home Living Lives With: Alone Available Help at Discharge: Family Type of Home: Apartment Home Access: Ramped entrance Prior Function Level of Independence: Independent with basic ADLs;Independent with homemaking with ambulation Driving: Yes Vocation: Retired Comments: enjoys reading and watching television  Assessment ADL/Vision/Perception ADL ADL Comments: Ms. Altidor has begun dressing and bathing herself this  week.  She is able to make a salad by herself.  She is not able to use her right hand as dominant with any functional activities.  Dominant Hand: Right Vision - History Baseline Vision: Wears glasses all the time  Cognition/Observation Cognition Overall Cognitive Status: Within Functional Limits for tasks assessed Observation/Other Assessments Observations: guarded positioning with her right arm, 5 inch surgical incision on anterior shoulder with steristrips in place.   Sensation/Coordination/Edema Sensation Light Touch: Appears Intact Coordination Gross Motor Movements are Fluid and Coordinated: Yes Fine Motor Movements are Fluid and Coordinated: Yes  Additional Assessments RUE AROM (degrees) RUE Overall AROM Comments: not assessed, per protocol RUE PROM (degrees) RUE Overall PROM Comments: assessed in supine, ER/IR with shoulder abducted Right Shoulder Flexion: 85 Degrees Right Shoulder ABduction: 80 Degrees Right Shoulder Internal Rotation: 85 Degrees Right Shoulder External Rotation: 0 Degrees RUE Strength RUE Overall Strength Comments: not assessed due to recent surgery  Palpation Palpation: max fascial restrictions in right scapular, trapezius region       Exercise/Treatments Stretches Table Stretch - Flexion: 5 reps Table Stretch - Abduction: 5 reps Table Stretch - External Rotation: 5 reps      Manual Therapy Manual Therapy: Myofascial release Myofascial Release: Myofascial release and trigger point release to right scapular, trapezius, rhomboid region, upper arm, and shoulder region to decrease pain and fascial restrictions and increase pain free mobility in right shoulder.   Occupational Therapy Assessment and Plan OT Assessment and Plan Clinical Impression Statement: A:  62 year old female presents s/p right total shoulder replacement.  Deficits include increased pain and restrictions and decreased AROM/PROM and strength, causing decreased I with all B/IADLs.    Pt will benefit from skilled therapeutic intervention in order to improve on the following deficits: Decreased strength;Decreased range of motion;Impaired UE functional use;Pain;Increased fascial restricitons;Increased muscle spasms Rehab Potential: Good OT Frequency: Min 2X/week OT Duration: 12 weeks OT Treatment/Interventions: Self-care/ADL training;Therapeutic exercise;Neuromuscular education;Manual therapy;Therapeutic activities;Modalities OT Plan: P:  Skilled OT intervention to decrease pain and fascial restrictions and increase pain free mobility needed to return to prior level of indepdendence will all B/IADLs and leisure activities.  Treatment Plan:  follow protocol.  PROM an Manual therapy in supine.  isometric strengthening in supine.  ball stretches, arom elev, ext, row, progress as protocol indicated.    Goals Short Term Goals Time to Complete Short Term Goals: 6 weeks Short Term Goal 1: Patient will be educated on a HEP. Short Term Goal 2: Patient will increase PROM in right shoulder region to [facility] for increased ability to don deoderant. Short Term Goal 3: Patient will increase right shoulder strength to 3+/5 for increased ability to lift groceries onto countertop. Short Term Goal 4: Patient will decrease fascial restrictions to moderate in her right shoulder region for greater mobility needed for ADLs. Short Term Goal 5: Patient will decrease pain to 3/10 in her right shoulder  region when donning and doffing shirts.  Long Term Goals Time to Complete Long Term Goals: 12 weeks Long Term Goal 1: Patient will return to prior level of independence with all B/IADLs, and leisure activities.  Long Term Goal 2: Patient will increase right shoulder AROM to [facility] for increased ability to reach into overhead cabinets.  Long Term Goal 3: Patient will increase right shoulder strength to 4+/5 for increased ability to lift pots and pans.  Long Term Goal 4: Patient will decrease pain in her right  shoulder to 1/10 when completing functional activities.  Long Term Goal 5: Patient will decrease fascial restrictions to minimal in her right shoulder region.    Problem List Patient Active Problem List   Diagnosis Date Noted  . Pain in joint, shoulder region 04/02/2013  . Muscle weakness (generalized) 04/02/2013  . Shoulder arthritis 02/28/2013  . Breast calcifications on mammogram 02/16/2012  . KNEE, ARTHRITIS, DEGEN./OSTEO 05/28/2009  . MORBID OBESITY 01/09/2007  . DIABETES MELLITUS, TYPE II, CONTROLLED 12/27/2006  . FIBROIDS, UTERUS 10/10/2006  . HYPERLIPIDEMIA 10/10/2006  . CARPAL TUNNEL SYNDROME 10/10/2006  . CATARACT NOS 10/10/2006  . HYPERTENSION 10/10/2006  . ALLERGIC RHINITIS 10/10/2006  . ASTHMA 10/10/2006  . GERD 10/10/2006  . IBS 10/10/2006  . OVERACTIVE BLADDER 10/10/2006  . ARTHRITIS 10/10/2006    End of Session Activity Tolerance: Patient tolerated treatment well General Behavior During Therapy: WFL for tasks assessed/performed Cognition: WFL for tasks performed OT Plan of Care OT Home Exercise Plan: towel slides - form Consulted and Agree with Plan of Care: Patient  GO Functional Assessment Tool Used: DASH, scored 72 with ideal score being 0. Functional Limitation: Self care Self Care Current Status [phone]): At least 60 percent but less than 80 percent impaired, limited or restricted Self Care Goal Status ([id number]): At least 1 percent but less than 20 percent impaired, limited or restricted  Shirlean Mylar, OTR/L  04/02/2013, 4:51 PM  Physician Documentation Your signature is required to indicate approval of the treatment plan as stated above.  Please sign and either send electronically or make a copy of this report for your files and return this physician signed original.  Please mark one 1.__approve of plan  2. ___approve of plan with the following conditions.   ______________________________                                                           _____________________ Physician Signature                                                                                                             Date

## 2013-04-04 ENCOUNTER — Ambulatory Visit (HOSPITAL_COMMUNITY)
Admission: RE | Admit: 2013-04-04 | Discharge: 2013-04-04 | Disposition: A | Discharge status: Home / Self Care | Payer: Medicare Other | Source: Ambulatory Visit | Attending: Orthopedic Surgery | Admitting: Orthopedic Surgery

## 2013-04-04 DIAGNOSIS — M25511 Pain in right shoulder: Secondary | ICD-10-CM

## 2013-04-04 DIAGNOSIS — M6281 Muscle weakness (generalized): Secondary | ICD-10-CM

## 2013-04-04 NOTE — Progress Notes (Signed)
Occupational Therapy Treatment Patient Details  Name: Felicia Wright MRN: [id number] Date of Birth: 1951-02-05  Today's Date: 04/04/2013 Time: 2130-8657 OT Time Calculation (min): 35 min Manual Therapy 8469-6295 20' Therapeutic Exercises [phone] 14' Visit#: 2 of 24  Re-eval: 04/30/13    Authorization: [employer]  Authorization Time Period: before 10th visit  Authorization Visit#: 2 of 10  Subjective Symptoms/Limitations Symptoms: S:  It doesnt hurt, it just burns. Limitations: Total Shoulder Replacement Protocol:  Currently 4 weeks post op:  PHASE II 4-10 weeks 03/29/13-05/10/13  PROM progress to AAROM, AAROM of ER/IR in supine 04/05/13.  AROM flexion in supine, seated flexion 45-90, seated abduction.  04/26/13 may add tband for ext rot and int rot.  Pain Assessment Currently in Pain?: Yes Pain Score:   3 Pain Location: Shoulder Pain Orientation: Right Pain Type: Acute pain  Precautions/Restrictions    otal Shoulder Replacement Protocol:  Currently 4 weeks post op:  PHASE II 4-10 weeks 03/29/13-05/10/13  PROM progress to AAROM, AAROM of ER/IR in supine 04/05/13.  AROM flexion in supine, seated flexion 45-90, seated abduction.  04/26/13 may add tband for ext rot and int rot.   Exercise/Treatments Supine Protraction: PROM;10 reps Horizontal ABduction: PROM;10 reps External Rotation: PROM;10 reps Internal Rotation: PROM;10 reps Flexion: PROM;AROM;10 reps Flexion Limitations: AROM short arc 45-90  ABduction: PROM;AROM;10 reps ABduction Limitations: AROM short arc 45-90  Seated Elevation: AROM;10 reps Extension: AROM;10 reps Row: AROM;10 reps Other Seated Exercises: elbow flexion and extension 10 times  Other Seated Exercises: shoulder rolls forward and backward  10 times each Therapy Ball Flexion: 15 reps ABduction: 15 reps Isometric Strengthening   Flexion: Supine;3X3" Extension: Supine;3X3" External Rotation: Supine;3X3" Internal Rotation:  Supine;3X3" ABduction: Supine;3X3" ADduction: Supine;3X3"    Manual Therapy Manual Therapy: Myofascial release Myofascial Release: Myofascial release and trigger point release to right scapular, trapezius, rhomboid region, upper arm, and shoulder region to decrease pain and fascial restrictions and increase pain free mobility in right shoulder.   Occupational Therapy Assessment and Plan OT Assessment and Plan Clinical Impression Statement: A:  Able to remove 2 additional steristrips this date.  Patient able to complete AROM short arc in supine for flexion and abduction without pain.  OT Plan: P:  Increase PROM to [facility], increase reps with all exercises and contraction time with isometric strengthening.    Goals Short Term Goals Short Term Goal 1: Patient will be educated on a HEP. Short Term Goal 2: Patient will increase PROM in right shoulder region to [facility] for increased ability to don deoderant. Short Term Goal 3: Patient will increase right shoulder strength to 3+/5 for increased ability to lift groceries onto countertop. Short Term Goal 4: Patient will decrease fascial restrictions to moderate in her right shoulder region for greater mobility needed for ADLs. Short Term Goal 5: Patient will decrease pain to 3/10 in her right shoulder region when donning and doffing shirts.  Long Term Goals Long Term Goal 1: Patient will return to prior level of independence with all B/IADLs, and leisure activities.  Long Term Goal 2: Patient will increase right shoulder AROM to [facility] for increased ability to reach into overhead cabinets.  Long Term Goal 3: Patient will increase right shoulder strength to 4+/5 for increased ability to lift pots and pans.  Long Term Goal 4: Patient will decrease pain in her right shoulder to 1/10 when completing functional activities.  Long Term Goal 5: Patient will decrease fascial restrictions to minimal in her right shoulder region.  Problem List Patient Active Problem  List   Diagnosis Date Noted  . Pain in joint, shoulder region 04/02/2013  . Muscle weakness (generalized) 04/02/2013  . Shoulder arthritis 02/28/2013  . Breast calcifications on mammogram 02/16/2012  . KNEE, ARTHRITIS, DEGEN./OSTEO 05/28/2009  . MORBID OBESITY 01/09/2007  . DIABETES MELLITUS, TYPE II, CONTROLLED 12/27/2006  . FIBROIDS, UTERUS 10/10/2006  . HYPERLIPIDEMIA 10/10/2006  . CARPAL TUNNEL SYNDROME 10/10/2006  . CATARACT NOS 10/10/2006  . HYPERTENSION 10/10/2006  . ALLERGIC RHINITIS 10/10/2006  . ASTHMA 10/10/2006  . GERD 10/10/2006  . IBS 10/10/2006  . OVERACTIVE BLADDER 10/10/2006  . ARTHRITIS 10/10/2006    End of Session Activity Tolerance: Patient tolerated treatment well General Behavior During Therapy: [facility] for tasks assessed/performed Cognition: WFL for tasks performed  GO    Shirlean Mylar, OTR/L  04/04/2013, 4:57 PM

## 2013-04-08 ENCOUNTER — Ambulatory Visit (HOSPITAL_COMMUNITY)
Admission: RE | Admit: 2013-04-08 | Discharge: 2013-04-08 | Disposition: A | Discharge status: Home / Self Care | Payer: Medicare Other | Source: Ambulatory Visit | Attending: Orthopedic Surgery | Admitting: Orthopedic Surgery

## 2013-04-08 DIAGNOSIS — M25619 Stiffness of unspecified shoulder, not elsewhere classified: Secondary | ICD-10-CM | POA: Insufficient documentation

## 2013-04-08 DIAGNOSIS — IMO0001 Reserved for inherently not codable concepts without codable children: Secondary | ICD-10-CM | POA: Insufficient documentation

## 2013-04-08 DIAGNOSIS — M6281 Muscle weakness (generalized): Secondary | ICD-10-CM

## 2013-04-08 DIAGNOSIS — M25511 Pain in right shoulder: Secondary | ICD-10-CM

## 2013-04-08 DIAGNOSIS — I10 Essential (primary) hypertension: Secondary | ICD-10-CM | POA: Insufficient documentation

## 2013-04-08 DIAGNOSIS — M25519 Pain in unspecified shoulder: Secondary | ICD-10-CM | POA: Insufficient documentation

## 2013-04-08 DIAGNOSIS — E119 Type 2 diabetes mellitus without complications: Secondary | ICD-10-CM | POA: Insufficient documentation

## 2013-04-08 NOTE — Progress Notes (Signed)
Occupational Therapy Treatment Patient Details  Name: Felicia Wright MRN: [id number] Date of Birth: 10-14-1951  Today's Date: 04/08/2013 Time: 4098-1191 OT Time Calculation (min): 51 min MFR [id number] 13' Therex 1308-6578 38'  Visit#: 3 of 24  Re-eval: 04/30/13    Authorization: [employer]  Authorization Time Period: before 10th visit  Authorization Visit#: 3 of 10  Subjective Symptoms/Limitations Symptoms: S: I don't have to wear my sling anymore and [facility] as long as I'm not lifting with my right arm yet.  Limitations: Total Shoulder Replacement Protocol: Currently 4 weeks post op: Initiate AAROM, rhymic stabilization, isometrics, pulleys. (Week 6-8 04/22/13): AAROM all motions to tolerance. Inititate AROM supine to seated. Theraband. (Week 9-12 05/13/13): Progress to strengthening Pain Assessment Currently in Pain?: No/denies  Precautions/Restrictions  Precautions Precautions: Shoulder  Exercise/Treatments Supine Protraction: PROM;AAROM;10 reps Horizontal ABduction: PROM;AAROM;10 reps External Rotation: PROM;AAROM;10 reps Internal Rotation: PROM;AAROM;10 reps Flexion: PROM;AAROM;10 reps ABduction: PROM;AAROM;10 reps Seated Elevation: AROM;10 reps Extension: AROM;10 reps Row: AROM;10 reps Other Seated Exercises: elbow flexion and extension 10 times  Other Seated Exercises: shoulder rolls forward and backward  10 times each Therapy Ball Flexion: 15 reps ABduction: 15 reps    Manual Therapy Manual Therapy: Myofascial release Myofascial Release: Myofascial release and trigger point release to right scapular, trapezius, rhomboid region, upper arm, and shoulder region to decrease pain and fascial restrictions and increase pain free mobility in right shoulder.  Occupational Therapy Assessment and Plan OT Assessment and Plan Clinical Impression Statement: A: Pt performed AAROM supine and tolerated well. Pt did get a  cramp in right bicep during Horizontal ABD. OT Plan: P: Progress to AAROM when able to tolerate. Add pulleys and pro/ret/elev/dep   Goals Short Term Goals Short Term Goal 1: Patient will be educated on a HEP. Short Term Goal 1 Progress: Progressing toward goal Short Term Goal 2: Patient will increase PROM in right shoulder region to [facility] for increased ability to don deoderant. Short Term Goal 2 Progress: Progressing toward goal Short Term Goal 3: Patient will increase right shoulder strength to 3+/5 for increased ability to lift groceries onto countertop. Short Term Goal 3 Progress: Progressing toward goal Short Term Goal 4: Patient will decrease fascial restrictions to moderate in her right shoulder region for greater mobility needed for ADLs. Short Term Goal 4 Progress: Progressing toward goal Short Term Goal 5: Patient will decrease pain to 3/10 in her right shoulder region when donning and doffing shirts.  Short Term Goal 5 Progress: Progressing toward goal Long Term Goals Long Term Goal 1: Patient will return to prior level of independence with all B/IADLs, and leisure activities.  Long Term Goal 1 Progress: Progressing toward goal Long Term Goal 2: Patient will increase right shoulder AROM to [facility] for increased ability to reach into overhead cabinets.  Long Term Goal 2 Progress: Progressing toward goal Long Term Goal 3: Patient will increase right shoulder strength to 4+/5 for increased ability to lift pots and pans.  Long Term Goal 3 Progress: Progressing toward goal Long Term Goal 4: Patient will decrease pain in her right shoulder to 1/10 when completing functional activities.  Long Term Goal 4 Progress: Progressing toward goal Long Term Goal 5: Patient will decrease fascial restrictions to minimal in her right shoulder region.   Long Term Goal 5 Progress: Progressing toward goal  Problem List Patient Active Problem List   Diagnosis Date Noted  . Pain in joint, shoulder  region  04/02/2013  . Muscle weakness (generalized) 04/02/2013  . Shoulder arthritis 02/28/2013  . Breast calcifications on mammogram 02/16/2012  . KNEE, ARTHRITIS, DEGEN./OSTEO 05/28/2009  . MORBID OBESITY 01/09/2007  . DIABETES MELLITUS, TYPE II, CONTROLLED 12/27/2006  . FIBROIDS, UTERUS 10/10/2006  . HYPERLIPIDEMIA 10/10/2006  . CARPAL TUNNEL SYNDROME 10/10/2006  . CATARACT NOS 10/10/2006  . HYPERTENSION 10/10/2006  . ALLERGIC RHINITIS 10/10/2006  . ASTHMA 10/10/2006  . GERD 10/10/2006  . IBS 10/10/2006  . OVERACTIVE BLADDER 10/10/2006  . ARTHRITIS 10/10/2006    End of Session Activity Tolerance: Patient tolerated treatment well General Behavior During Therapy: [facility] for tasks assessed/performed Cognition: [facility] for tasks performed   Limmie Patricia, OTR/L,CBIS   04/08/2013, 2:57 PM

## 2013-04-09 ENCOUNTER — Encounter (HOSPITAL_COMMUNITY): Payer: Self-pay | Admitting: Dietician

## 2013-04-09 NOTE — Progress Notes (Signed)
Follow-Up Outpatient Nutrition Note Date: 04/09/2013  Appt Start Time: 1352  Nutrition Assessment:  Current weight: Weight: 270 lb (122.471 kg)  BMI: Body mass index is 43.6 kg/(m^2).  Weight changes: =2# (0.7%) x 1 month  Felicia Wright is upset that she has not lost weight. She reports "I knew it. The therapists told me I needed to eat more to help my shoulder heal". She reports snacking has been an issue in the past, as she has been "bored", since she is not been able to be as independent as she would like. She reports that she was told that her brother does not have to help her with her arm exercises, but still needs his assistance with lifting and driving. She has been improving from a therapy standpoint, reporting she has transitioned from Home Health to outpatient PT.  She reports her blood sugars are improving. Readings are more consistent.They are in the range of 101-113, as opposed to 90-160. Calorie levels are variable, ranging from 1200-3000 daily. She reports this is due to eating out more, since she has had various doctor appointments in the past few weeks. She reports "I knew I messed up yesterday because I went to [facility]".  She is now exercising 3 times per day with therapy exercises. She is now able to walk without assistance of a cane. She just got clearance to go back to the [facility] to use equipment like the treadmill, that does not involve arms. Her only barrier in going to the [facility] is that she is unable to drive and someone must take her. She reports her plan is to walk around her neighborhood for exercise.   Labs: CMP     Component Value Date/Time   NA 132* 02/22/2013 1145   K 4.2 02/22/2013 1145   CL 98 02/22/2013 1145   CO2 26 02/22/2013 1145   GLUCOSE 86 02/22/2013 1145   BUN 20 02/22/2013 1145   CREATININE 0.85 02/22/2013 1145   CALCIUM 10.0 02/22/2013 1145   PROT 6.9 06/19/2009 1256   ALBUMIN 4.3 06/19/2009 1256   AST 20 06/19/2009 1256   ALT 12 06/19/2009 1256   ALKPHOS 62 06/19/2009  1256   BILITOT 0.9 06/19/2009 1256   GFRNONAA 71* 02/22/2013 1145   GFRAA 83* 02/22/2013 1145    Lipid Panel     Component Value Date/Time   CHOL 144 06/03/2009 2311   TRIG 96 06/03/2009 2311   HDL 61 06/03/2009 2311   CHOLHDL 2.4 Ratio 06/03/2009 2311   VLDL 19 06/03/2009 2311   LDLCALC 64 06/03/2009 2311     Lab Results  Component Value Date   HGBA1C 5.7 07/22/2009   HGBA1C 5.8 06/03/2009   HGBA1C 5.4 03/04/2009   Lab Results  Component Value Date   MICROALBUR 0.21 11/13/2008   LDLCALC 64 06/03/2009   CREATININE 0.85 02/22/2013     Diet recall: Breakfast: 1 scoop of raw fit powder, 2 cups Activia Light; Lunch: grilled salmon, carrot and raisin salad, cole slaw, cream spinach, strawberry pie;  Dinner: flatbread cheese pizza, bag of sun chips, 3 oatmeal raisin cookies. Beverages consist of water only.   Nutrition Diagnosis: Involuntary weight gain r/t physical inactivity AEB 27# wt gain x 4 years.   Nutrition Intervention: Nutrition rx: 1500 kcal NAS, diabetic diet; 3 meals per day (45-60 grams carbohydrate per meal); limit snacks; low calorie beverages only; 30 minutes physical activity 5 times per week  Education/ counseling provided: Closely reviewed food diary with patient and  discussed ways to help her achieve a healthier lifestyle. Reemphasized dangers of a hypocaloric diet and emphasized importance of consuming at least 1200 kcals daily, in order to support and maintain healthy weight loss as well as support adequate nutrition. Reviewed portion sizes and discussed ideas for meals and snacks. Discussed importance of adhering to therapy recommendations. Reemphasized principles of healthy diet and keeping food diary. Encouraged pt to continue to increase physical activity as able. Encouraged pt to eat in an area free of distractions. Emphasized importance of making practical changes that pt will be able to follow long term to maintain weight loss. Provided emotional support. Praised pt for  progress made. Teachback method used.  Understanding/Motivation/ Ability to follow recommendations: Expect fair to good compliance.   Monitoring and Evaluation: Previous Goals: 1) 1-2# wt loss per week- progressing; 2) 3 meals per day- progressing; 3) 30 minutess physical activity 5 times per week- progressing  Goals for next visit: 1) 1-2# wt loss per week; 2) 3 meals per day; 3) 30 minutes physical activity 5 times per week  Recommendations: 1) Consume no less than 1200 kcals daily; 2) Stay within confines of physical activity limitations per therapy  F/U: 4-6 weeks. F/U scheduled for Tuesday, 05/08/13 at 1000.   Melody Haver, RD, LDN Date:04/09/2013 Appt EndTime: 1428

## 2013-04-10 IMAGING — CR DG SHOULDER 2+V*R*
3 series · 3 of 3 positions shown · non-contrast
Comparison: None
Correlation:  MRI right shoulder 01/07/2012

CLINICAL DATA: Fall

RIGHT SHOULDER - 2+ VIEW

[view not recorded (1 of 3)]
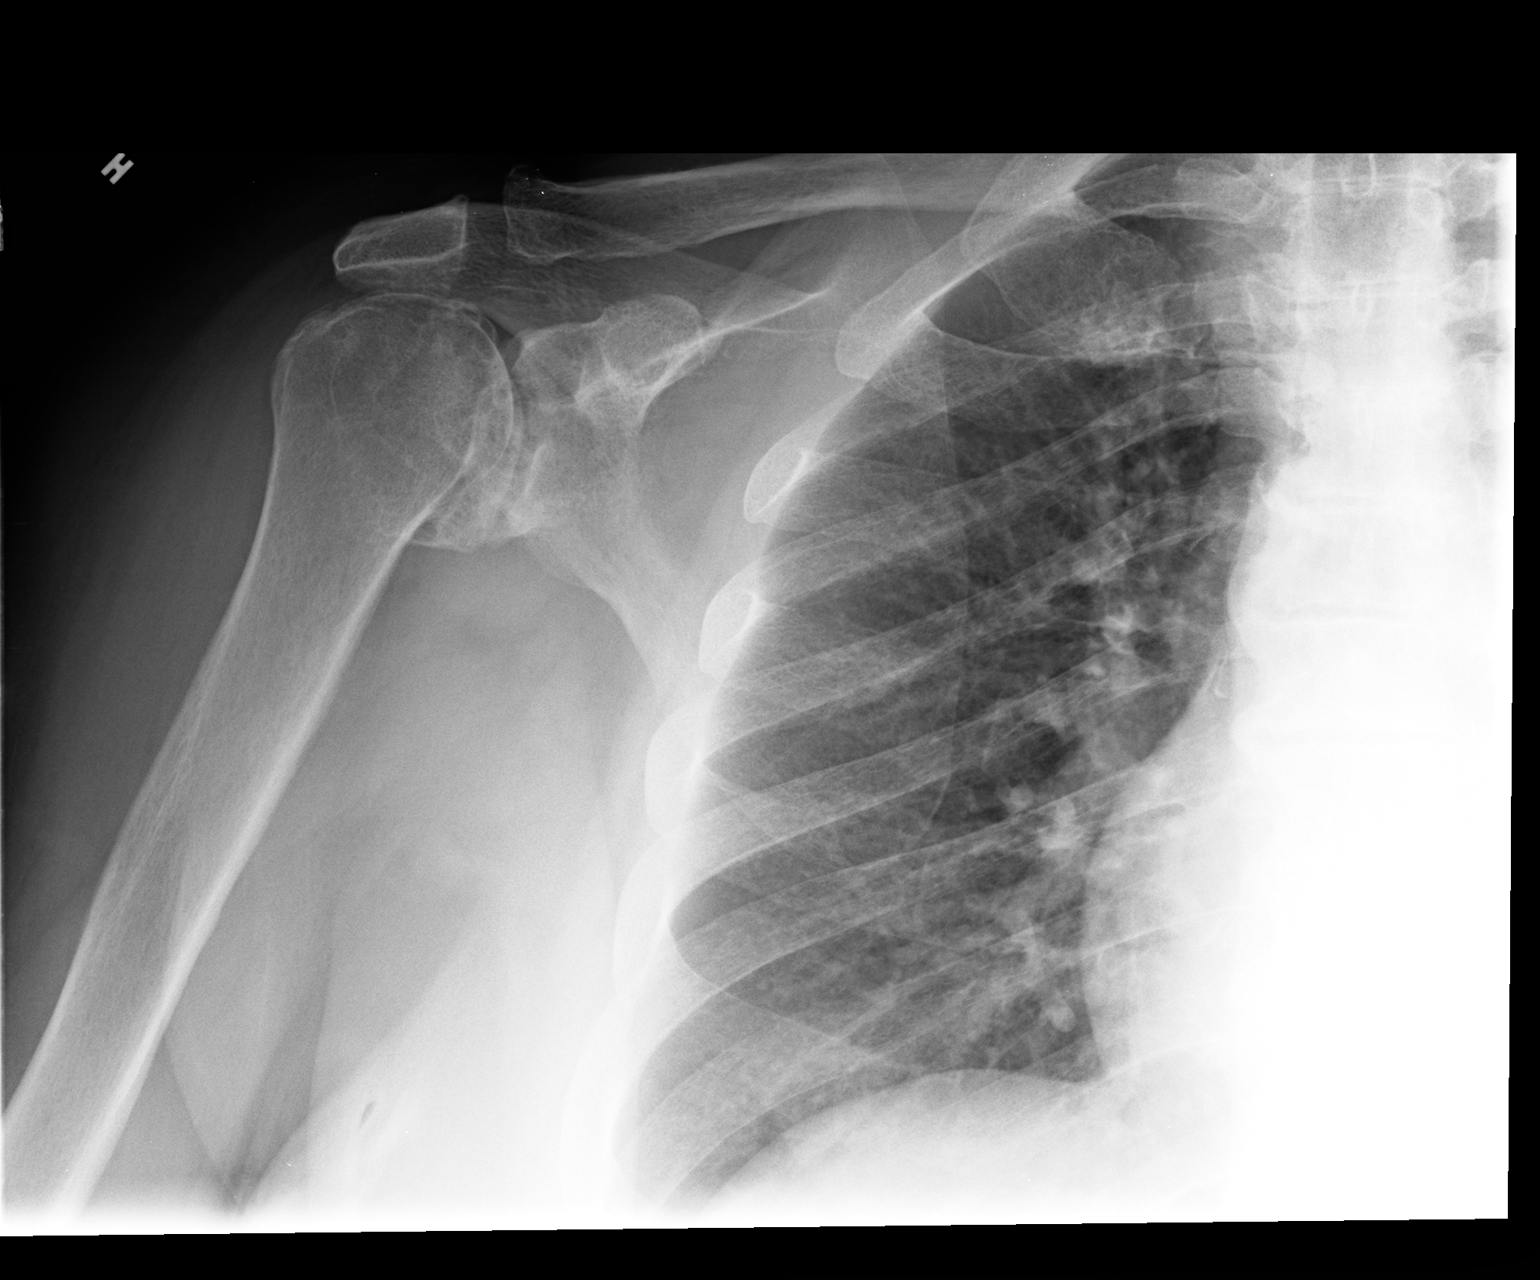

[view not recorded (2 of 3)]
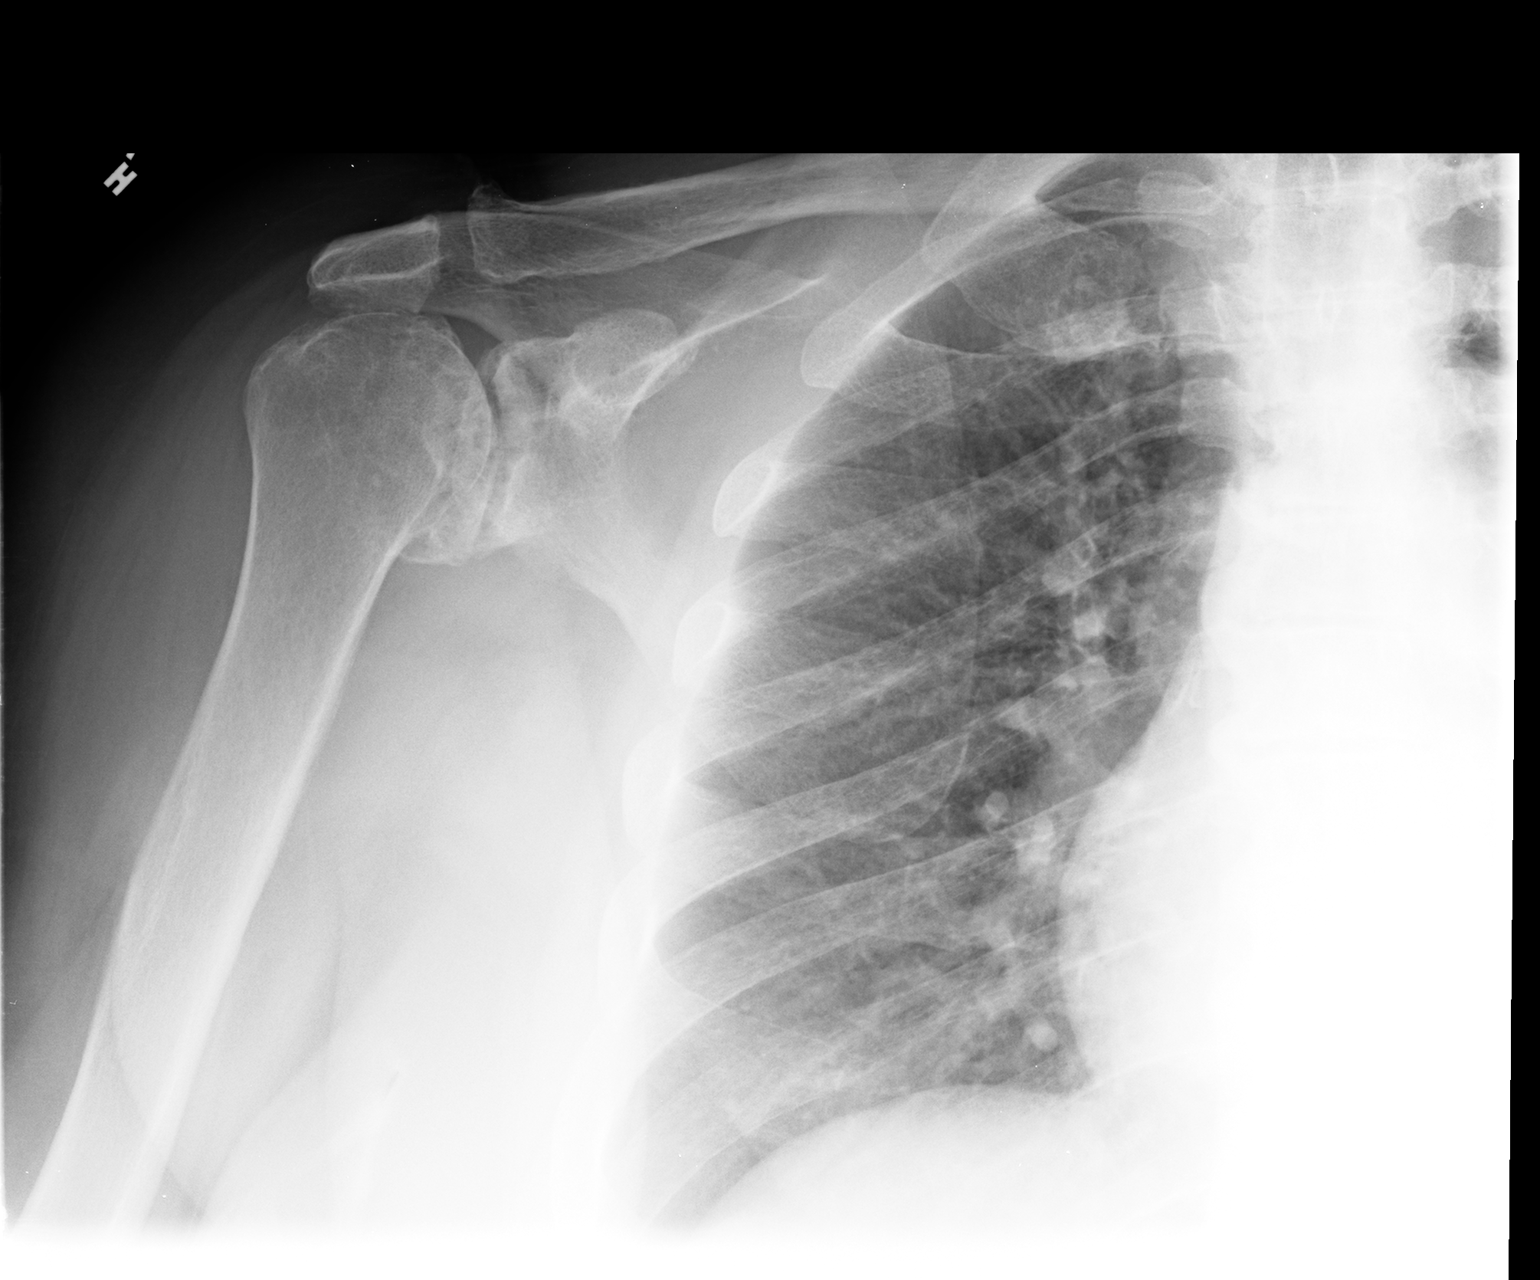

[view not recorded (3 of 3)]
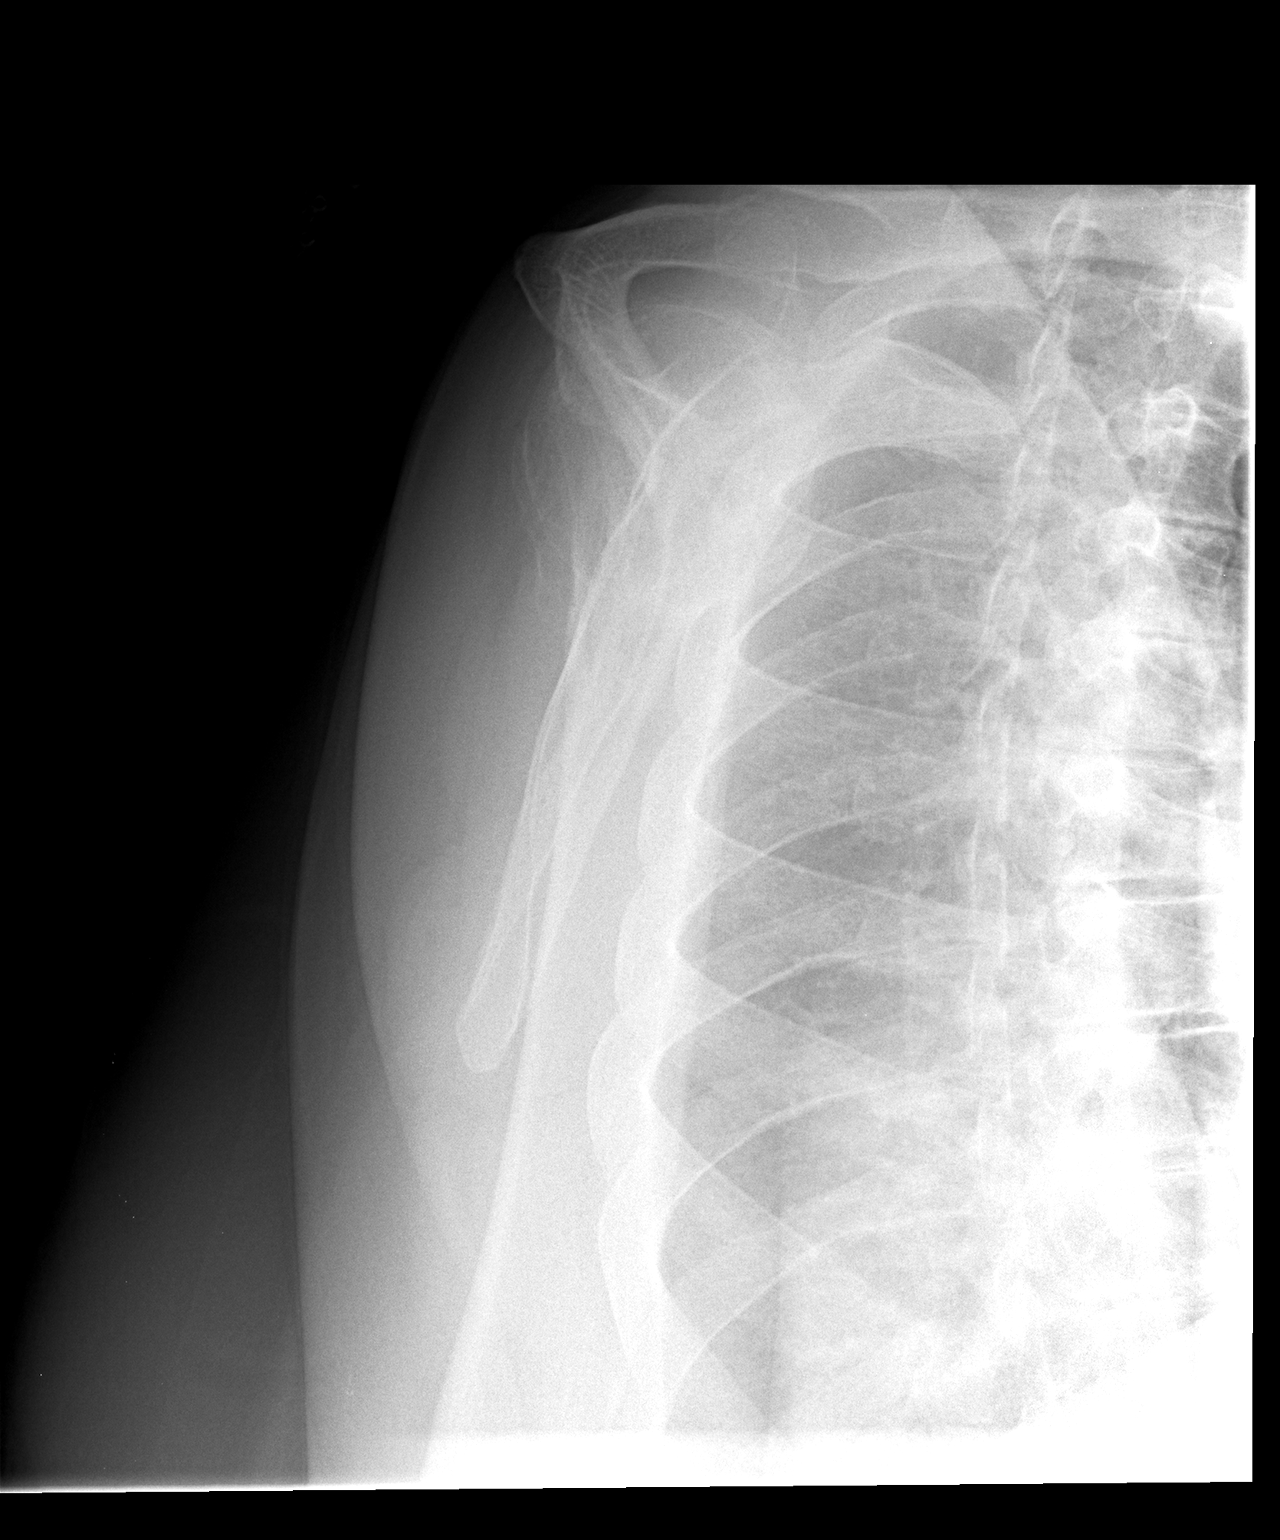

[3 of 3 positions shown; findings below may reference images not displayed]

FINDINGS: Osseous demineralization.
AC joint alignment normal.
Advanced right glenohumeral degenerative changes with joint space
narrowing and spur formation.
No acute fracture, dislocation, or bone destruction.
IMPRESSION: Osteoarthritic changes right shoulder.
Osseous demineralization.
No acute abnormalities.

## 2013-04-12 ENCOUNTER — Ambulatory Visit (HOSPITAL_COMMUNITY): Payer: Medicare Other | Admitting: Specialist

## 2013-04-16 ENCOUNTER — Ambulatory Visit (HOSPITAL_COMMUNITY)
Admission: RE | Admit: 2013-04-16 | Discharge: 2013-04-16 | Disposition: A | Discharge status: Home / Self Care | Payer: Medicare Other | Source: Ambulatory Visit | Attending: Orthopedic Surgery | Admitting: Orthopedic Surgery

## 2013-04-16 DIAGNOSIS — M25511 Pain in right shoulder: Secondary | ICD-10-CM

## 2013-04-16 DIAGNOSIS — M6281 Muscle weakness (generalized): Secondary | ICD-10-CM

## 2013-04-16 NOTE — Progress Notes (Signed)
Occupational Therapy Treatment Patient Details  Name: Felicia Wright MRN: [id number] Date of Birth: 02/24/1951  Today's Date: 04/16/2013 Time: 1350-1450 OT Time Calculation (min): 60 min Manual Therapy 1350-1418 28' Therapeutic Exercises [phone] 1430-1450 not charged 12' Visit#: 4 of 24  Re-eval: 04/30/13    Authorization: [employer]  Authorization Time Period: before 10th visit  Authorization Visit#: 4 of 10  Subjective Symptoms/Limitations Symptoms: S Limitations: Total Shoulder Replacement Protocol: Currently 4 weeks post op: Initiate AAROM, rhymic stabilization, isometrics, pulleys. (Week 6-8 04/22/13): AAROM all motions to tolerance. Inititate AROM supine to seated. Theraband. (Week 9-12 05/13/13): Progress to strengthening Pain Assessment Currently in Pain?: Yes Pain Score:   8 Pain Location: Shoulder Pain Orientation: Right Pain Type: Acute pain  Precautions/Restrictions     Total Shoulder Replacement Protocol: Currently 4 weeks post op: Initiate AAROM, rhymic stabilization, isometrics, pulleys. (Week 6-8 04/22/13): AAROM all motions to tolerance. Inititate AROM supine to seated. Theraband. (Week 9-12 05/13/13): Progress to strengthening   Exercise/Treatments Supine Protraction: PROM;10 reps;AAROM;12 reps Horizontal ABduction: PROM;10 reps;AAROM;12 reps External Rotation: PROM;10 reps;AAROM;12 reps Internal Rotation: PROM;10 reps;AAROM;12 reps Flexion: PROM;10 reps;AAROM;12 reps Flexion Limitations: AROM short arc 45-90  ABduction: PROM;10 reps;AAROM;12 reps ABduction Limitations: AROM short arc 45-90  Seated Elevation: AROM;15 reps Extension: AROM;15 reps Row: AROM;15 reps Other Seated Exercises: elbow flexion and extension 15 times  Other Seated Exercises: shoulder rolls forward and backward  10 times each Therapy Ball Flexion: 20 reps ABduction: 20 reps      Manual Therapy Manual Therapy: Myofascial release Myofascial Release: Myofascial  release and trigger point release to right scapular, trapezius, rhomboid region, upper arm, and shoulder region to decrease pain and fascial restrictions and increase pain free mobility in right shoulder.  Occupational Therapy Assessment and Plan OT Assessment and Plan Clinical Impression Statement: A:  Pain decreased from 8/10 to 4/10 after MFR to anterior shoulder region.  Increased PROM in supine this date.  OT Plan: P: Add pulleys and pro/ret/elev/dep   Goals Short Term Goals Short Term Goal 1: Patient will be educated on a HEP. Short Term Goal 1 Progress: Progressing toward goal Short Term Goal 2: Patient will increase PROM in right shoulder region to [facility] for increased ability to don deoderant. Short Term Goal 2 Progress: Progressing toward goal Short Term Goal 3: Patient will increase right shoulder strength to 3+/5 for increased ability to lift groceries onto countertop. Short Term Goal 3 Progress: Progressing toward goal Short Term Goal 4: Patient will decrease fascial restrictions to moderate in her right shoulder region for greater mobility needed for ADLs. Short Term Goal 4 Progress: Progressing toward goal Short Term Goal 5: Patient will decrease pain to 3/10 in her right shoulder region when donning and doffing shirts.  Short Term Goal 5 Progress: Progressing toward goal Long Term Goals Long Term Goal 1: Patient will return to prior level of independence with all B/IADLs, and leisure activities.  Long Term Goal 1 Progress: Progressing toward goal Long Term Goal 2: Patient will increase right shoulder AROM to [facility] for increased ability to reach into overhead cabinets.  Long Term Goal 2 Progress: Progressing toward goal Long Term Goal 3: Patient will increase right shoulder strength to 4+/5 for increased ability to lift pots and pans.  Long Term Goal 3 Progress: Progressing toward goal Long Term Goal 4: Patient will decrease pain in her right shoulder to 1/10 when completing  functional activities.  Long Term Goal 4 Progress: Progressing toward goal Long Term Goal 5:  Patient will decrease fascial restrictions to minimal in her right shoulder region.   Long Term Goal 5 Progress: Progressing toward goal  Problem List Patient Active Problem List   Diagnosis Date Noted  . Pain in joint, shoulder region 04/02/2013  . Muscle weakness (generalized) 04/02/2013  . Shoulder arthritis 02/28/2013  . Breast calcifications on mammogram 02/16/2012  . KNEE, ARTHRITIS, DEGEN./OSTEO 05/28/2009  . MORBID OBESITY 01/09/2007  . DIABETES MELLITUS, TYPE II, CONTROLLED 12/27/2006  . FIBROIDS, UTERUS 10/10/2006  . HYPERLIPIDEMIA 10/10/2006  . CARPAL TUNNEL SYNDROME 10/10/2006  . CATARACT NOS 10/10/2006  . HYPERTENSION 10/10/2006  . ALLERGIC RHINITIS 10/10/2006  . ASTHMA 10/10/2006  . GERD 10/10/2006  . IBS 10/10/2006  . OVERACTIVE BLADDER 10/10/2006  . ARTHRITIS 10/10/2006    End of Session Activity Tolerance: Patient tolerated treatment well General Behavior During Therapy: [facility] for tasks assessed/performed Cognition: WFL for tasks performed  GO    Shirlean Mylar, OTR/L  04/16/2013, 3:07 PM

## 2013-04-18 ENCOUNTER — Ambulatory Visit (HOSPITAL_COMMUNITY)
Admission: RE | Admit: 2013-04-18 | Discharge: 2013-04-18 | Disposition: A | Discharge status: Home / Self Care | Payer: Medicare Other | Source: Ambulatory Visit | Attending: Family Medicine | Admitting: Family Medicine

## 2013-04-18 DIAGNOSIS — M25511 Pain in right shoulder: Secondary | ICD-10-CM

## 2013-04-18 DIAGNOSIS — M6281 Muscle weakness (generalized): Secondary | ICD-10-CM

## 2013-04-18 NOTE — Progress Notes (Signed)
Occupational Therapy Treatment Patient Details  Name: Felicia Wright MRN: [id number] Date of Birth: [birth year]  Today's Date: 04/18/2013 Time: 1351-1430 OT Time Calculation (min): 39 min MFR 1351-1405 14' Therex 9528-4132 25'  Visit#: 5 of 24  Re-eval: 04/30/13    Authorization: [employer]  Authorization Time Period: before 10th visit  Authorization Visit#: 5 of 10  Subjective Symptoms/Limitations Symptoms: S: I learned how to cut without hurting my shoulder. Pain Assessment Currently in Pain?: No/denies  Precautions/Restrictions  Precautions Precautions: Shoulder  Exercise/Treatments Supine Protraction: PROM;10 reps;AAROM;12 reps Horizontal ABduction: PROM;10 reps;AAROM;12 reps External Rotation: PROM;10 reps;AAROM;12 reps Internal Rotation: PROM;10 reps;AAROM;12 reps Flexion: PROM;10 reps;AAROM;12 reps ABduction: PROM;10 reps;AAROM;12 reps Seated Elevation: AROM;15 reps Extension: AROM;15 reps Row: AROM;15 reps Protraction: AAROM;10 reps Horizontal ABduction: AAROM;10 reps External Rotation: AAROM;10 reps Internal Rotation: AAROM;10 reps Flexion: AAROM;10 reps Abduction: AAROM;10 reps Other Seated Exercises: elbow flexion and extension 15 times  Other Seated Exercises: shoulder rolls forward and backward  10 times each ROM / Strengthening / Isometric Strengthening Proximal Shoulder Strengthening, Supine: 10X, rest breaks between each position      Manual Therapy Manual Therapy: Myofascial release Myofascial Release: Myofascial release and trigger point release to right scapular, trapezius, rhomboid region, upper arm, and shoulder region to decrease pain and fascial restrictions and increase pain free mobility in right shoulder.  Occupational Therapy Assessment and Plan OT Assessment and Plan Clinical Impression Statement: A: Added AAROM seated. Patient tolerated well.  OT Plan: P: Add pulleys and pro/ret/elev/dep   Goals Short Term  Goals Short Term Goal 1: Patient will be educated on a HEP. Short Term Goal 2: Patient will increase PROM in right shoulder region to [facility] for increased ability to don deoderant. Short Term Goal 3: Patient will increase right shoulder strength to 3+/5 for increased ability to lift groceries onto countertop. Short Term Goal 4: Patient will decrease fascial restrictions to moderate in her right shoulder region for greater mobility needed for ADLs. Short Term Goal 5: Patient will decrease pain to 3/10 in her right shoulder region when donning and doffing shirts.  Long Term Goals Long Term Goal 1: Patient will return to prior level of independence with all B/IADLs, and leisure activities.  Long Term Goal 2: Patient will increase right shoulder AROM to [facility] for increased ability to reach into overhead cabinets.  Long Term Goal 3: Patient will increase right shoulder strength to 4+/5 for increased ability to lift pots and pans.  Long Term Goal 4: Patient will decrease pain in her right shoulder to 1/10 when completing functional activities.  Long Term Goal 5: Patient will decrease fascial restrictions to minimal in her right shoulder region.    Problem List Patient Active Problem List   Diagnosis Date Noted  . Pain in joint, shoulder region 04/02/2013  . Muscle weakness (generalized) 04/02/2013  . Shoulder arthritis 02/28/2013  . Breast calcifications on mammogram 02/16/2012  . KNEE, ARTHRITIS, DEGEN./OSTEO 05/28/2009  . MORBID OBESITY 01/09/2007  . DIABETES MELLITUS, TYPE II, CONTROLLED 12/27/2006  . FIBROIDS, UTERUS 10/10/2006  . HYPERLIPIDEMIA 10/10/2006  . CARPAL TUNNEL SYNDROME 10/10/2006  . CATARACT NOS 10/10/2006  . HYPERTENSION 10/10/2006  . ALLERGIC RHINITIS 10/10/2006  . ASTHMA 10/10/2006  . GERD 10/10/2006  . IBS 10/10/2006  . OVERACTIVE BLADDER 10/10/2006  . ARTHRITIS 10/10/2006    End of Session Activity Tolerance: Patient tolerated treatment well General Behavior During  Therapy: [facility] for tasks assessed/performed Cognition: [facility] for tasks performed   [employer], OTR/L,CBIS  04/18/2013, 2:59 PM

## 2013-04-23 ENCOUNTER — Ambulatory Visit (HOSPITAL_COMMUNITY)
Admission: RE | Admit: 2013-04-23 | Discharge: 2013-04-23 | Disposition: A | Discharge status: Home / Self Care | Payer: Medicare Other | Source: Ambulatory Visit | Attending: Orthopedic Surgery | Admitting: Orthopedic Surgery

## 2013-04-23 DIAGNOSIS — M6281 Muscle weakness (generalized): Secondary | ICD-10-CM

## 2013-04-23 DIAGNOSIS — M25511 Pain in right shoulder: Secondary | ICD-10-CM

## 2013-04-23 NOTE — Progress Notes (Signed)
Occupational Therapy Treatment Patient Details  Name: Felicia Wright MRN: [id number] Date of Birth: 1951-09-07  Today's Date: 04/23/2013 Time: 1350-1436 OT Time Calculation (min): 46 min MFR 1350-1415 25' Therex 4098-1191 21'  Visit#: 6 of 24  Re-eval: 04/30/13    Authorization: [employer]  Authorization Time Period: before 10th visit  Authorization Visit#: 6 of 10  Subjective Symptoms/Limitations Symptoms: S: Friday I couldn't do anything. I was really sore from our session on Thursday. The rest of the weekend I was fine though.  Pain Assessment Currently in Pain?: No/denies  Precautions/Restrictions  Precautions Precautions: Shoulder  Exercise/Treatments Supine Protraction: PROM;10 reps;AAROM;12 reps Horizontal ABduction: PROM;10 reps;AAROM;12 reps External Rotation: PROM;10 reps;AAROM;12 reps Internal Rotation: PROM;10 reps;AAROM;12 reps Flexion: PROM;10 reps;AAROM;12 reps ABduction: PROM;10 reps;AAROM;12 reps   Pulleys Flexion: 1 minute ABduction: 1 minute ROM / Strengthening / Isometric Strengthening Thumb Tacks: 1' Prot/Ret//Elev/Dep: 1'      Manual Therapy Manual Therapy: Myofascial release Myofascial Release: Myofascial release and trigger point release to right scapular, trapezius, rhomboid region, upper arm, and shoulder region to decrease pain and fascial restrictions and increase pain free mobility in right shoulder.  Occupational Therapy Assessment and Plan OT Assessment and Plan Clinical Impression Statement: A; Added pulleys, pro/ret/elev/dep, and thumb tacks. Patient tolerated well.  OT Plan: P: Resume AAROM seated.   Goals Short Term Goals Short Term Goal 1: Patient will be educated on a HEP. Short Term Goal 1 Progress: Progressing toward goal Short Term Goal 2: Patient will increase PROM in right shoulder region to [facility] for increased ability to don deoderant. Short Term Goal 2 Progress: Progressing toward goal Short Term Goal  3: Patient will increase right shoulder strength to 3+/5 for increased ability to lift groceries onto countertop. Short Term Goal 3 Progress: Progressing toward goal Short Term Goal 4: Patient will decrease fascial restrictions to moderate in her right shoulder region for greater mobility needed for ADLs. Short Term Goal 4 Progress: Progressing toward goal Short Term Goal 5: Patient will decrease pain to 3/10 in her right shoulder region when donning and doffing shirts.  Short Term Goal 5 Progress: Progressing toward goal Long Term Goals Long Term Goal 1: Patient will return to prior level of independence with all B/IADLs, and leisure activities.  Long Term Goal 1 Progress: Progressing toward goal Long Term Goal 2: Patient will increase right shoulder AROM to [facility] for increased ability to reach into overhead cabinets.  Long Term Goal 2 Progress: Progressing toward goal Long Term Goal 3: Patient will increase right shoulder strength to 4+/5 for increased ability to lift pots and pans.  Long Term Goal 3 Progress: Progressing toward goal Long Term Goal 4: Patient will decrease pain in her right shoulder to 1/10 when completing functional activities.  Long Term Goal 4 Progress: Progressing toward goal Long Term Goal 5: Patient will decrease fascial restrictions to minimal in her right shoulder region.   Long Term Goal 5 Progress: Progressing toward goal  Problem List Patient Active Problem List   Diagnosis Date Noted  . Pain in joint, shoulder region 04/02/2013  . Muscle weakness (generalized) 04/02/2013  . Shoulder arthritis 02/28/2013  . Breast calcifications on mammogram 02/16/2012  . KNEE, ARTHRITIS, DEGEN./OSTEO 05/28/2009  . MORBID OBESITY 01/09/2007  . DIABETES MELLITUS, TYPE II, CONTROLLED 12/27/2006  . FIBROIDS, UTERUS 10/10/2006  . HYPERLIPIDEMIA 10/10/2006  . CARPAL TUNNEL SYNDROME 10/10/2006  . CATARACT NOS 10/10/2006  . HYPERTENSION 10/10/2006  . ALLERGIC RHINITIS 10/10/2006   . ASTHMA 10/10/2006  .  GERD 10/10/2006  . IBS 10/10/2006  . OVERACTIVE BLADDER 10/10/2006  . ARTHRITIS 10/10/2006    End of Session Activity Tolerance: Patient tolerated treatment well General Behavior During Therapy: [facility] for tasks assessed/performed Cognition: [facility] for tasks performed   Limmie Patricia, OTR/L,CBIS   04/23/2013, 2:41 PM

## 2013-04-25 ENCOUNTER — Ambulatory Visit (HOSPITAL_COMMUNITY)
Admission: RE | Admit: 2013-04-25 | Discharge: 2013-04-25 | Disposition: A | Discharge status: Home / Self Care | Payer: Medicare Other | Source: Ambulatory Visit

## 2013-04-25 DIAGNOSIS — M6281 Muscle weakness (generalized): Secondary | ICD-10-CM

## 2013-04-25 DIAGNOSIS — M25511 Pain in right shoulder: Secondary | ICD-10-CM

## 2013-04-25 NOTE — Progress Notes (Signed)
Occupational Therapy Treatment Patient Details  Name: Felicia Wright MRN: [id number] Date of Birth: [birth year]  Today's Date: 04/25/2013 Time: 4098-1191 OT Time Calculation (min): 38 min MFR 1357-1410 13' Therex 4782-9562 25'  Visit#: 7 of 24  Re-eval: 04/30/13    Authorization: [employer]  Authorization Time Period: before 10th visit  Authorization Visit#: 7 of 10  Subjective Symptoms/Limitations Symptoms: S: I wasn't sore at all after last session. I felt really great. Pain Assessment Currently in Pain?: No/denies  Precautions/Restrictions  Precautions Precautions: Shoulder  Exercise/Treatments Supine Protraction: PROM;10 reps;AAROM;12 reps Horizontal ABduction: PROM;10 reps;AAROM;12 reps External Rotation: PROM;10 reps;AAROM;12 reps Internal Rotation: PROM;10 reps;AAROM;12 reps Flexion: PROM;10 reps;AAROM;12 reps ABduction: PROM;10 reps;AAROM;12 reps Seated Protraction: AAROM;10 reps Horizontal ABduction: AAROM;10 reps External Rotation: AAROM;10 reps Internal Rotation: AAROM;10 reps Flexion: AAROM;10 reps Abduction: AAROM;10 reps Pulleys Flexion: 1 minute ABduction: 1 minute ROM / Strengthening / Isometric Strengthening Proximal Shoulder Strengthening, Supine: 10X       Manual Therapy Manual Therapy: Myofascial release Myofascial Release: Myofascial release and trigger point release to right scapular, trapezius, rhomboid region, upper arm, and shoulder region to decrease pain and fascial restrictions and increase pain free mobility in right shoulder.  Occupational Therapy Assessment and Plan OT Assessment and Plan Clinical Impression Statement: A: Pt given dowel rod HEP. Pt was able to perform PROM with increased range.  OT Plan: P: Cont. to work on [employer] to be [facility]. Add theraband for IR/ER.   Goals Short Term Goals Short Term Goal 1: Patient will be educated on a HEP. Short Term Goal 2: Patient will increase PROM in right shoulder  region to [facility] for increased ability to don deoderant. Short Term Goal 3: Patient will increase right shoulder strength to 3+/5 for increased ability to lift groceries onto countertop. Short Term Goal 4: Patient will decrease fascial restrictions to moderate in her right shoulder region for greater mobility needed for ADLs. Short Term Goal 5: Patient will decrease pain to 3/10 in her right shoulder region when donning and doffing shirts.  Long Term Goals Long Term Goal 1: Patient will return to prior level of independence with all B/IADLs, and leisure activities.  Long Term Goal 2: Patient will increase right shoulder AROM to [facility] for increased ability to reach into overhead cabinets.  Long Term Goal 3: Patient will increase right shoulder strength to 4+/5 for increased ability to lift pots and pans.  Long Term Goal 4: Patient will decrease pain in her right shoulder to 1/10 when completing functional activities.  Long Term Goal 5: Patient will decrease fascial restrictions to minimal in her right shoulder region.    Problem List Patient Active Problem List   Diagnosis Date Noted  . Pain in joint, shoulder region 04/02/2013  . Muscle weakness (generalized) 04/02/2013  . Shoulder arthritis 02/28/2013  . Breast calcifications on mammogram 02/16/2012  . KNEE, ARTHRITIS, DEGEN./OSTEO 05/28/2009  . MORBID OBESITY 01/09/2007  . DIABETES MELLITUS, TYPE II, CONTROLLED 12/27/2006  . FIBROIDS, UTERUS 10/10/2006  . HYPERLIPIDEMIA 10/10/2006  . CARPAL TUNNEL SYNDROME 10/10/2006  . CATARACT NOS 10/10/2006  . HYPERTENSION 10/10/2006  . ALLERGIC RHINITIS 10/10/2006  . ASTHMA 10/10/2006  . GERD 10/10/2006  . IBS 10/10/2006  . OVERACTIVE BLADDER 10/10/2006  . ARTHRITIS 10/10/2006    End of Session Activity Tolerance: Patient tolerated treatment well General Behavior During Therapy: WFL for tasks assessed/performed Cognition: WFL for tasks performed OT Plan of Care OT Home Exercise Plan: dowel  rod exercises OT Patient Instructions: handout Consulted  and Agree with Plan of Care: Patient   Limmie Patricia, OTR/L,CBIS   04/25/2013, 3:03 PM

## 2013-04-26 ENCOUNTER — Telehealth (HOSPITAL_COMMUNITY): Payer: Self-pay | Admitting: Dietician

## 2013-04-26 NOTE — Telephone Encounter (Signed)
Called pt at 1040 to reschedule appointment on 05/08/13, due to provider conflict. Appointment rescheduled for 05/15/13 at 1000.

## 2013-04-30 ENCOUNTER — Ambulatory Visit (HOSPITAL_COMMUNITY)
Admission: RE | Admit: 2013-04-30 | Discharge: 2013-04-30 | Disposition: A | Discharge status: Home / Self Care | Payer: Medicare Other | Source: Ambulatory Visit | Attending: Orthopedic Surgery | Admitting: Orthopedic Surgery

## 2013-04-30 NOTE — Progress Notes (Signed)
Occupational Therapy Treatment Patient Details  Name: Felicia Wright MRN: [id number] Date of Birth: 1951/07/15  Today's Date: 04/30/2013 Time: 8295-6213 OT Time Calculation (min): 41 min MFR 1350-1400 10' Therex 0865-7846 31'  Visit#: 8 of 24  Re-eval: 04/30/13    Authorization: [employer]  Authorization Time Period: before 10th visit  Authorization Visit#: 8 of 10  Subjective Symptoms/Limitations Symptoms: S: I tried to vacuum up the mess left by the new shower. My shoulder is sore from that. I ended up calling my brother over to help me finish.  Pain Assessment Currently in Pain?: Yes Pain Score:   3 Pain Location: Shoulder Pain Orientation: Right Pain Type: Acute pain  Precautions/Restrictions     Exercise/Treatments Supine Protraction: PROM;AROM;10 reps Horizontal ABduction: PROM;AROM;10 reps External Rotation: PROM;AROM;10 reps Internal Rotation: PROM;AROM;10 reps Flexion: PROM;AROM;10 reps ABduction: PROM;AROM;10 reps Standing External Rotation: Theraband;10 reps;Limitations Theraband Level (Shoulder External Rotation): Level 2 (Red) External Rotation Limitations: assist from therapist for proper form and technique Internal Rotation: Theraband;10 reps Theraband Level (Shoulder Internal Rotation): Level 2 (Red) Extension: Theraband;10 reps Theraband Level (Shoulder Extension): Level 2 (Red) Row: Theraband;10 reps Theraband Level (Shoulder Row): Level 2 (Red) Retraction: Theraband;10 reps Theraband Level (Shoulder Retraction): Level 2 (Red) ROM / Strengthening / Isometric Strengthening UBE (Upper Arm Bike): 1.0 2' forward 2' reverse Wall Wash: 1' Thumb Tacks: 1'      Manual Therapy Manual Therapy: Myofascial release Myofascial Release: Myofascial release and trigger point release to right scapular, trapezius, rhomboid region, upper arm, and shoulder region to decrease pain and fascial restrictions and increase pain free mobility in right  shoulder  Occupational Therapy Assessment and Plan OT Assessment and Plan Clinical Impression Statement: A: Per protocol, added AROM supine, theraband, and wall wash. Pt tolerated well.  OT Plan: P: REASSESS. Progress to AROM seated.    Goals Short Term Goals Short Term Goal 1: Patient will be educated on a HEP. Short Term Goal 1 Progress: Progressing toward goal Short Term Goal 2: Patient will increase PROM in right shoulder region to [facility] for increased ability to don deoderant. Short Term Goal 2 Progress: Progressing toward goal Short Term Goal 3: Patient will increase right shoulder strength to 3+/5 for increased ability to lift groceries onto countertop. Short Term Goal 3 Progress: Progressing toward goal Short Term Goal 4: Patient will decrease fascial restrictions to moderate in her right shoulder region for greater mobility needed for ADLs. Short Term Goal 4 Progress: Progressing toward goal Short Term Goal 5: Patient will decrease pain to 3/10 in her right shoulder region when donning and doffing shirts.  Short Term Goal 5 Progress: Progressing toward goal Long Term Goals Long Term Goal 1: Patient will return to prior level of independence with all B/IADLs, and leisure activities.  Long Term Goal 1 Progress: Progressing toward goal Long Term Goal 2: Patient will increase right shoulder AROM to [facility] for increased ability to reach into overhead cabinets.  Long Term Goal 2 Progress: Progressing toward goal Long Term Goal 3: Patient will increase right shoulder strength to 4+/5 for increased ability to lift pots and pans.  Long Term Goal 3 Progress: Progressing toward goal Long Term Goal 4: Patient will decrease pain in her right shoulder to 1/10 when completing functional activities.  Long Term Goal 4 Progress: Progressing toward goal Long Term Goal 5: Patient will decrease fascial restrictions to minimal in her right shoulder region.   Long Term Goal 5 Progress: Progressing toward  goal  Problem List Patient  Active Problem List   Diagnosis Date Noted  . Pain in joint, shoulder region 04/02/2013  . Muscle weakness (generalized) 04/02/2013  . Shoulder arthritis 02/28/2013  . Breast calcifications on mammogram 02/16/2012  . KNEE, ARTHRITIS, DEGEN./OSTEO 05/28/2009  . MORBID OBESITY 01/09/2007  . DIABETES MELLITUS, TYPE II, CONTROLLED 12/27/2006  . FIBROIDS, UTERUS 10/10/2006  . HYPERLIPIDEMIA 10/10/2006  . CARPAL TUNNEL SYNDROME 10/10/2006  . CATARACT NOS 10/10/2006  . HYPERTENSION 10/10/2006  . ALLERGIC RHINITIS 10/10/2006  . ASTHMA 10/10/2006  . GERD 10/10/2006  . IBS 10/10/2006  . OVERACTIVE BLADDER 10/10/2006  . ARTHRITIS 10/10/2006    End of Session Activity Tolerance: Patient tolerated treatment well General Behavior During Therapy: [facility] for tasks assessed/performed Cognition: [facility] for tasks performed   Limmie Patricia, OTR/L,CBIS   04/30/2013, 2:36 PM

## 2013-05-02 ENCOUNTER — Ambulatory Visit (HOSPITAL_COMMUNITY)
Admission: RE | Admit: 2013-05-02 | Discharge: 2013-05-02 | Disposition: A | Discharge status: Home / Self Care | Payer: Medicare Other | Source: Ambulatory Visit | Attending: Orthopedic Surgery | Admitting: Orthopedic Surgery

## 2013-05-02 DIAGNOSIS — M25511 Pain in right shoulder: Secondary | ICD-10-CM

## 2013-05-02 DIAGNOSIS — M6281 Muscle weakness (generalized): Secondary | ICD-10-CM

## 2013-05-02 NOTE — Progress Notes (Signed)
Occupational Therapy Treatment Patient Details  Name: Felicia Wright MRN: [id number] Date of Birth: [birth year]  Today's Date: 05/02/2013 Time: 6578-4696 OT Time Calculation (min): 43 min Manual Therapy 1352-1416 24' ROM testing 2952-8413 19' Visit#: 9 of 24  Re-eval: 05/30/13    Authorization: [employer]  Authorization Time Period: before 19th visit  Authorization Visit#: 9 of 19  Subjective S:  I no that I need to be more patient.  Limitations: Total Shoulder Replacement Protocol: Currently 4 weeks post op: Initiate AAROM, rhymic stabilization, isometrics, pulleys. (Week 6-8 04/22/13): AAROM all motions to tolerance. Inititate AROM supine to seated. Theraband. (Week 9-12 05/13/13): Progress to strengthening Special Tests: DASH scored 29.5 wtih ideal score being 0. Pain Assessment Currently in Pain?: Yes Pain Score:   1 Pain Location: Shoulder Pain Orientation: Right Pain Type: Acute pain  Precautions/Restrictions     (Week 6-8 04/22/13): AAROM all motions to tolerance. Inititate AROM supine to seated. Theraband. (Week 9-12 05/13/13): Progress to strengthening   Exercise/Treatments PROM of shoulder 10 X all ranges.         Occupational Therapy Assessment and Plan OT Assessment and Plan Clinical Impression Statement: A:  Reassessment completed this date supine A/PROM (supine PROM)  flexion 140/152 (85), abduction 150/170 (80), external rotation with shoulder adducted 12/20 (0), internal rotation with shoulder adducted 85 (85).  Strength is 3-/5 throughout.  Patient is having increased independence with washing under his arm, dressing, bathing, pulling up pants, and tying her shoes.  She continues to have difficulty fastening her bra behind her back, vacuuming, and reaching overhead to straighten her curtains.  SHe has met  3/5 short term goals. Her DASH score improved from 72 to 29.5 OT Frequency: Min 2X/week OT Duration: 4 weeks OT Plan: P:  Incrase control of active  shoulder/arm movements in supine and seated.     Goals Short Term Goals Short Term Goal 1: Patient will be educated on a HEP. Short Term Goal 1 Progress: Met Short Term Goal 2: Patient will increase PROM in right shoulder region to [facility] for increased ability to don deoderant. Short Term Goal 2 Progress: Met Short Term Goal 3: Patient will increase right shoulder strength to 3+/5 for increased ability to lift groceries onto countertop. Short Term Goal 3 Progress: Progressing toward goal Short Term Goal 4: Patient will decrease fascial restrictions to moderate in her right shoulder region for greater mobility needed for ADLs. Short Term Goal 4 Progress: Met Short Term Goal 5: Patient will decrease pain to 3/10 in her right shoulder region when donning and doffing shirts.  Short Term Goal 5 Progress: Met Long Term Goals Long Term Goal 1: Patient will return to prior level of independence with all B/IADLs, and leisure activities.  Long Term Goal 1 Progress: Progressing toward goal Long Term Goal 2: Patient will increase right shoulder AROM to [facility] for increased ability to reach into overhead cabinets.  Long Term Goal 2 Progress: Progressing toward goal Long Term Goal 3: Patient will increase right shoulder strength to 4+/5 for increased ability to lift pots and pans.  Long Term Goal 3 Progress: Progressing toward goal Long Term Goal 4: Patient will decrease pain in her right shoulder to 1/10 when completing functional activities.  Long Term Goal 4 Progress: Progressing toward goal Long Term Goal 5: Patient will decrease fascial restrictions to minimal in her right shoulder region.   Long Term Goal 5 Progress: Progressing toward goal  Problem List Patient Active Problem List   Diagnosis  Date Noted  . Pain in joint, shoulder region 04/02/2013  . Muscle weakness (generalized) 04/02/2013  . Shoulder arthritis 02/28/2013  . Breast calcifications on mammogram 02/16/2012  . KNEE, ARTHRITIS,  DEGEN./OSTEO 05/28/2009  . MORBID OBESITY 01/09/2007  . DIABETES MELLITUS, TYPE II, CONTROLLED 12/27/2006  . FIBROIDS, UTERUS 10/10/2006  . HYPERLIPIDEMIA 10/10/2006  . CARPAL TUNNEL SYNDROME 10/10/2006  . CATARACT NOS 10/10/2006  . HYPERTENSION 10/10/2006  . ALLERGIC RHINITIS 10/10/2006  . ASTHMA 10/10/2006  . GERD 10/10/2006  . IBS 10/10/2006  . OVERACTIVE BLADDER 10/10/2006  . ARTHRITIS 10/10/2006    End of Session Activity Tolerance: Patient tolerated treatment well General Behavior During Therapy: WFL for tasks assessed/performed Cognition: WFL for tasks performed  GO Functional Assessment Tool Used: DASH scored 29.5 with ideal score being 0, score on intial evaluation was 72.5 Self Care Current Status (Z6109): At least 20 percent but less than 40 percent impaired, limited or restricted Self Care Goal Status (U0454): At least 1 percent but less than 20 percent impaired, limited or restricted  Shirlean Mylar, OTR/L  05/02/2013, 3:13 PM

## 2013-05-07 ENCOUNTER — Ambulatory Visit (HOSPITAL_COMMUNITY)
Admission: RE | Admit: 2013-05-07 | Discharge: 2013-05-07 | Disposition: A | Discharge status: Home / Self Care | Payer: Medicare Other | Source: Ambulatory Visit | Attending: Family Medicine | Admitting: Family Medicine

## 2013-05-07 DIAGNOSIS — M25511 Pain in right shoulder: Secondary | ICD-10-CM

## 2013-05-07 DIAGNOSIS — M25519 Pain in unspecified shoulder: Secondary | ICD-10-CM | POA: Insufficient documentation

## 2013-05-07 DIAGNOSIS — I10 Essential (primary) hypertension: Secondary | ICD-10-CM | POA: Insufficient documentation

## 2013-05-07 DIAGNOSIS — M6281 Muscle weakness (generalized): Secondary | ICD-10-CM

## 2013-05-07 DIAGNOSIS — IMO0001 Reserved for inherently not codable concepts without codable children: Secondary | ICD-10-CM | POA: Insufficient documentation

## 2013-05-07 DIAGNOSIS — M25619 Stiffness of unspecified shoulder, not elsewhere classified: Secondary | ICD-10-CM | POA: Insufficient documentation

## 2013-05-07 DIAGNOSIS — E119 Type 2 diabetes mellitus without complications: Secondary | ICD-10-CM | POA: Insufficient documentation

## 2013-05-07 NOTE — Progress Notes (Signed)
Occupational Therapy Treatment Patient Details  Name: Felicia Wright MRN: [id number] Date of Birth: August 12, 1951  Today's Date: 05/07/2013 Time: 1914-7829 OT Time Calculation (min): 48 min MFR 1350-1405 15' Therex 5621-3086 33'   Visit#: 10 of 24  Re-eval: 05/30/13    Authorization: [employer]  Authorization Time Period: before 19th visit  Authorization Visit#: 10 of 19  Subjective Symptoms/Limitations Symptoms: S: My doctor said I give into the pain too much and I need to use my arm as much as possible.  Pain Assessment Currently in Pain?: No/denies  Precautions/Restrictions  Precautions Precautions: Shoulder  Exercise/Treatments Supine Protraction: PROM;10 reps;AROM;12 reps Horizontal ABduction: PROM;10 reps;AROM;12 reps External Rotation: PROM;10 reps;AROM;12 reps Internal Rotation: PROM;10 reps;AROM;12 reps Flexion: PROM;10 reps;AROM;12 reps ABduction: PROM;10 reps;AROM;12 reps Standing External Rotation: Theraband;10 reps Theraband Level (Shoulder External Rotation): Level 2 (Red) Internal Rotation: Theraband;10 reps Theraband Level (Shoulder Internal Rotation): Level 2 (Red) Extension: Theraband;10 reps Theraband Level (Shoulder Extension): Level 2 (Red) Row: Theraband;10 reps Theraband Level (Shoulder Row): Level 2 (Red) Retraction: Theraband;10 reps Theraband Level (Shoulder Retraction): Level 2 (Red) ROM / Strengthening / Isometric Strengthening UBE (Upper Arm Bike): 1.0 2' forward 2' reverse Proximal Shoulder Strengthening, Supine: 10X      Manual Therapy Manual Therapy: Myofascial release Myofascial Release: Myofascial release and trigger point release to right scapular, trapezius, rhomboid region, upper arm, and shoulder region to decrease pain and fascial restrictions and increase pain free mobility in right shoulder  Occupational Therapy Assessment and Plan OT Assessment and Plan Clinical Impression Statement: A: Patient states that  her doctor said she needs to work harder on moving shoulder as much as possible. Dr. believes that she gives into the pain too much. Patient tolerated further PROM of her shoulder this date.  OT Plan: P: Per protocol, begin strengthening. Add 1# with supine exercises.    Goals Short Term Goals Short Term Goal 1: Patient will be educated on a HEP. Short Term Goal 2: Patient will increase PROM in right shoulder region to [facility] for increased ability to don deoderant. Short Term Goal 3: Patient will increase right shoulder strength to 3+/5 for increased ability to lift groceries onto countertop. Short Term Goal 3 Progress: Progressing toward goal Short Term Goal 4: Patient will decrease fascial restrictions to moderate in her right shoulder region for greater mobility needed for ADLs. Short Term Goal 5: Patient will decrease pain to 3/10 in her right shoulder region when donning and doffing shirts.  Long Term Goals Long Term Goal 1: Patient will return to prior level of independence with all B/IADLs, and leisure activities.  Long Term Goal 1 Progress: Progressing toward goal Long Term Goal 2: Patient will increase right shoulder AROM to [facility] for increased ability to reach into overhead cabinets.  Long Term Goal 2 Progress: Progressing toward goal Long Term Goal 3: Patient will increase right shoulder strength to 4+/5 for increased ability to lift pots and pans.  Long Term Goal 3 Progress: Progressing toward goal Long Term Goal 4: Patient will decrease pain in her right shoulder to 1/10 when completing functional activities.  Long Term Goal 4 Progress: Progressing toward goal Long Term Goal 5: Patient will decrease fascial restrictions to minimal in her right shoulder region.   Long Term Goal 5 Progress: Progressing toward goal  Problem List Patient Active Problem List   Diagnosis Date Noted  . Pain in joint, shoulder region 04/02/2013  . Muscle weakness (generalized) 04/02/2013  . Shoulder  arthritis 02/28/2013  . Breast  calcifications on mammogram 02/16/2012  . KNEE, ARTHRITIS, DEGEN./OSTEO 05/28/2009  . MORBID OBESITY 01/09/2007  . DIABETES MELLITUS, TYPE II, CONTROLLED 12/27/2006  . FIBROIDS, UTERUS 10/10/2006  . HYPERLIPIDEMIA 10/10/2006  . CARPAL TUNNEL SYNDROME 10/10/2006  . CATARACT NOS 10/10/2006  . HYPERTENSION 10/10/2006  . ALLERGIC RHINITIS 10/10/2006  . ASTHMA 10/10/2006  . GERD 10/10/2006  . IBS 10/10/2006  . OVERACTIVE BLADDER 10/10/2006  . ARTHRITIS 10/10/2006    End of Session Activity Tolerance: Patient tolerated treatment well General Behavior During Therapy: [facility] for tasks assessed/performed Cognition: [facility] for tasks performed   Limmie Patricia, OTR/L,CBIS   05/07/2013, 2:38 PM

## 2013-05-09 ENCOUNTER — Ambulatory Visit (HOSPITAL_COMMUNITY)
Admission: RE | Admit: 2013-05-09 | Discharge: 2013-05-09 | Disposition: A | Discharge status: Home / Self Care | Payer: Medicare Other | Source: Ambulatory Visit | Attending: Family Medicine | Admitting: Family Medicine

## 2013-05-09 NOTE — Progress Notes (Signed)
Occupational Therapy Treatment Patient Details  Name: Felicia Wright MRN: [id number] Date of Birth: 1951/07/28  Today's Date: 05/09/2013 Time: 4098-1191 OT Time Calculation (min): 41 min Manual therapy 1400-1411 11' Therapeutic exercises 1411-1441 30' Visit#: 11 of 24  Re-eval: 05/30/13    Authorization: [employer]  Authorization Time Period: before 19th visit  Authorization Visit#: 11 of 19  Subjective Symptoms/Limitations Symptoms: S:  I know I ned to stretch my arm. Limitations: Total Shoulder Replacement Protocol: Currently 4 weeks post op: Initiate AAROM, rhymic stabilization, isometrics, pulleys. (Week 6-8 04/22/13): AAROM all motions to tolerance. Inititate AROM supine to seated. Theraband. (Week 9-12 05/13/13): Progress to strengthening Pain Assessment Currently in Pain?: No/denies  Precautions/Restrictions    Total Shoulder Replacement Protocol: Currently 4 weeks post op: Initiate AAROM, rhymic stabilization, isometrics, pulleys. (Week 6-8 04/22/13): AAROM all motions to tolerance. Inititate AROM supine to seated. Theraband. (Week 9-12 05/13/13): Progress to strengthening   Exercise/Treatments Supine Protraction: PROM;Strengthening;10 reps Protraction Weight (lbs): 1 Horizontal ABduction: PROM;Strengthening;10 reps Horizontal ABduction Weight (lbs): 1 External Rotation: PROM;Strengthening;5 reps External Rotation Weight (lbs): 1 Internal Rotation: PROM;Strengthening;10 reps Internal Rotation Weight (lbs): 1 Flexion: PROM;Strengthening;10 reps Shoulder Flexion Weight (lbs): 1 ABduction: PROM;Strengthening;10 reps Shoulder ABduction Weight (lbs): 1 Seated Protraction: AROM;10 reps;Limitations Protraction Limitations: tactile and vg to depress shoulder Horizontal ABduction: AROM;10 reps;Limitations Horizontal ABduction Limitations: vg and tactile cues to depress shoulder External Rotation: AAROM;15 reps Internal Rotation: AAROM;15 reps Flexion: AROM;10  reps;Limitations Flexion Limitations: 50% range Abduction: AROM;10 reps;Limitations ABduction Limitations: 50% range   Therapy Ball Right/Left: 5 reps ROM / Strengthening / Isometric Strengthening UBE (Upper Arm Bike): 1.5 3' forward 3' reverse Wall Wash: 2' Proximal Shoulder Strengthening, Supine: 10X with 1# resistance      Manual Therapy Manual Therapy: Myofascial release Myofascial Release: Myofascial release and trigger point release to right scapular, trapezius, rhomboid region, upper arm, and shoulder region to decrease pain and fascial restrictions and increase pain free mobility in right shoulder  Occupational Therapy Assessment and Plan OT Assessment and Plan Clinical Impression Statement: A:  Required vg to and tactile cues with seated AROM  to depress shoulder blade.  Began strengthening in supine OT Plan: P:  Increase to 12 reps with supine strengthening, less cuing with seated exercises, resume tband exercises .    Goals Short Term Goals Short Term Goal 1: Patient will be educated on a HEP. Short Term Goal 2: Patient will increase PROM in right shoulder region to [facility] for increased ability to don deoderant. Short Term Goal 3: Patient will increase right shoulder strength to 3+/5 for increased ability to lift groceries onto countertop. Short Term Goal 4: Patient will decrease fascial restrictions to moderate in her right shoulder region for greater mobility needed for ADLs. Short Term Goal 5: Patient will decrease pain to 3/10 in her right shoulder region when donning and doffing shirts.  Long Term Goals Long Term Goal 1: Patient will return to prior level of independence with all B/IADLs, and leisure activities.  Long Term Goal 2: Patient will increase right shoulder AROM to [facility] for increased ability to reach into overhead cabinets.  Long Term Goal 3: Patient will increase right shoulder strength to 4+/5 for increased ability to lift pots and pans.  Long Term Goal 4:  Patient will decrease pain in her right shoulder to 1/10 when completing functional activities.  Long Term Goal 5: Patient will decrease fascial restrictions to minimal in her right shoulder region.    Problem List Patient Active  Problem List   Diagnosis Date Noted  . Pain in joint, shoulder region 04/02/2013  . Muscle weakness (generalized) 04/02/2013  . Shoulder arthritis 02/28/2013  . Breast calcifications on mammogram 02/16/2012  . KNEE, ARTHRITIS, DEGEN./OSTEO 05/28/2009  . MORBID OBESITY 01/09/2007  . DIABETES MELLITUS, TYPE II, CONTROLLED 12/27/2006  . FIBROIDS, UTERUS 10/10/2006  . HYPERLIPIDEMIA 10/10/2006  . CARPAL TUNNEL SYNDROME 10/10/2006  . CATARACT NOS 10/10/2006  . HYPERTENSION 10/10/2006  . ALLERGIC RHINITIS 10/10/2006  . ASTHMA 10/10/2006  . GERD 10/10/2006  . IBS 10/10/2006  . OVERACTIVE BLADDER 10/10/2006  . ARTHRITIS 10/10/2006    End of Session Activity Tolerance: Patient tolerated treatment well General Behavior During Therapy: [facility] for tasks assessed/performed Cognition: WFL for tasks performed  GO    Shirlean Mylar, OTR/L  05/09/2013, 3:00 PM

## 2013-05-14 ENCOUNTER — Ambulatory Visit (HOSPITAL_COMMUNITY)
Admission: RE | Admit: 2013-05-14 | Discharge: 2013-05-14 | Disposition: A | Discharge status: Home / Self Care | Payer: Medicare Other | Source: Ambulatory Visit | Attending: Family Medicine | Admitting: Family Medicine

## 2013-05-14 DIAGNOSIS — M25511 Pain in right shoulder: Secondary | ICD-10-CM

## 2013-05-14 DIAGNOSIS — M6281 Muscle weakness (generalized): Secondary | ICD-10-CM

## 2013-05-14 NOTE — Progress Notes (Signed)
Occupational Therapy Treatment Patient Details  Name: Felicia Wright MRN: [id number] Date of Birth: [birth year]  Today's Date: 05/14/2013 Time: 5284-1324 OT Time Calculation (min): 46 min MFR 4010-2725 21' Therex 3664-4034 25'  Visit#: 12 of 24  Re-eval: 05/30/13    Authorization: [employer]  Authorization Time Period: before 19th visit  Authorization Visit#: 12 of 19  Subjective Symptoms/Limitations Symptoms: S: I brought my water weight with my and I want to know if I can use this at home to do my exercises. (Water weight equals approx. 1#. Gave the ok to use at home). Pain Assessment Currently in Pain?: No/denies  Precautions/Restrictions  Precautions Precautions: Shoulder  Exercise/Treatments Supine Protraction: PROM;5 reps;Strengthening;12 reps Protraction Weight (lbs): 1 Horizontal ABduction: PROM;5 reps;Strengthening;12 reps Horizontal ABduction Weight (lbs): 1 External Rotation: PROM;5 reps;Strengthening;12 reps External Rotation Weight (lbs): 1 Internal Rotation: PROM;5 reps;Strengthening;12 reps Internal Rotation Weight (lbs): 1 Flexion: PROM;5 reps;Strengthening;12 reps Shoulder Flexion Weight (lbs): 1 ABduction: PROM;5 reps;Strengthening;12 reps Shoulder ABduction Weight (lbs): 1 Therapy Ball Right/Left: 5 reps ROM / Strengthening / Isometric Strengthening UBE (Upper Arm Bike): 2.0 3' forward 3' reverse Proximal Shoulder Strengthening, Supine: 10X with 1# resistance      Manual Therapy Manual Therapy: Myofascial release Myofascial Release: Myofascial release and trigger point release to right scapular, trapezius, rhomboid region, upper arm, and shoulder region to decrease pain and fascial restrictions and increase pain free mobility in right shoulder  Occupational Therapy Assessment and Plan OT Assessment and Plan Clinical Impression Statement: A: Increased reps with supine exercises. Increased resistance on UBE bike. Tolerated well.  OT  Plan: P: Resume theraband exercises.    Goals Short Term Goals Short Term Goal 1: Patient will be educated on a HEP. Short Term Goal 2: Patient will increase PROM in right shoulder region to [facility] for increased ability to don deoderant. Short Term Goal 3: Patient will increase right shoulder strength to 3+/5 for increased ability to lift groceries onto countertop. Short Term Goal 3 Progress: Progressing toward goal Short Term Goal 4: Patient will decrease fascial restrictions to moderate in her right shoulder region for greater mobility needed for ADLs. Short Term Goal 5: Patient will decrease pain to 3/10 in her right shoulder region when donning and doffing shirts.  Long Term Goals Long Term Goal 1: Patient will return to prior level of independence with all B/IADLs, and leisure activities.  Long Term Goal 1 Progress: Progressing toward goal Long Term Goal 2: Patient will increase right shoulder AROM to [facility] for increased ability to reach into overhead cabinets.  Long Term Goal 2 Progress: Progressing toward goal Long Term Goal 3: Patient will increase right shoulder strength to 4+/5 for increased ability to lift pots and pans.  Long Term Goal 3 Progress: Progressing toward goal Long Term Goal 4: Patient will decrease pain in her right shoulder to 1/10 when completing functional activities.  Long Term Goal 4 Progress: Progressing toward goal Long Term Goal 5: Patient will decrease fascial restrictions to minimal in her right shoulder region.   Long Term Goal 5 Progress: Progressing toward goal  Problem List Patient Active Problem List   Diagnosis Date Noted  . Pain in joint, shoulder region 04/02/2013  . Muscle weakness (generalized) 04/02/2013  . Shoulder arthritis 02/28/2013  . Breast calcifications on mammogram 02/16/2012  . KNEE, ARTHRITIS, DEGEN./OSTEO 05/28/2009  . MORBID OBESITY 01/09/2007  . DIABETES MELLITUS, TYPE II, CONTROLLED 12/27/2006  . FIBROIDS, UTERUS 10/10/2006  .  HYPERLIPIDEMIA 10/10/2006  . CARPAL TUNNEL SYNDROME  10/10/2006  . CATARACT NOS 10/10/2006  . HYPERTENSION 10/10/2006  . ALLERGIC RHINITIS 10/10/2006  . ASTHMA 10/10/2006  . GERD 10/10/2006  . IBS 10/10/2006  . OVERACTIVE BLADDER 10/10/2006  . ARTHRITIS 10/10/2006    End of Session Activity Tolerance: Patient tolerated treatment well General Behavior During Therapy: [facility] for tasks assessed/performed Cognition: [facility] for tasks performed   Limmie Patricia, OTR/L,CBIS   05/14/2013, 2:41 PM

## 2013-05-15 ENCOUNTER — Encounter (HOSPITAL_COMMUNITY): Payer: Self-pay | Admitting: Dietician

## 2013-05-15 NOTE — Progress Notes (Addendum)
Follow-Up Outpatient Nutrition Note Date: 05/15/2013  Appt Start Time: 0952 Nutrition Assessment:  Current weight: Weight: 269 lb (122.018 kg)  BMI: Body mass index is 43.44 kg/(m^2).  Weight changes: -1# (0.3%) x 1 month  Felicia Wright reports she is angry at herself for not losing weight and eating too much. She reports that she has been depressed as of late, because she is not recovering for her surgery as quickly as she would like (she wanted to recover in 6 months, however, recovery period is typically 6 months to 1 year). She reports that due to her lack of progress and pain from therapy, "I ate a lot". Detailed food diary indicated pt went out to eat or snacked frequently. Calroe counts range from [id number] kcals daily. CBGs run from 99-124; pt is concerned about this because they are rising higher than normal and she does not want to go on medication.  She has made a lot of progress in her independence, including cooking for herself, doing most of her ADLs, and, most recently, driving.  She reports that she has purchased a hand bicycle, which she tends to use to be more active. She is also contemplating going to the [facility] to go on the treadmill, however, she reports she has a fear of falling and reinjuring her shoulder. She also reports "I will go to the pool once I can fit into my swimsuit".  Physical activity has been limited, due to restrictions from mobility of recent shoulder surgery. Excessive snacking and frequent eating out are also barriers to weight loss.   Labs: CMP     Component Value Date/Time   NA 132* 02/22/2013 1145   K 4.2 02/22/2013 1145   CL 98 02/22/2013 1145   CO2 26 02/22/2013 1145   GLUCOSE 86 02/22/2013 1145   BUN 20 02/22/2013 1145   CREATININE 0.85 02/22/2013 1145   CALCIUM 10.0 02/22/2013 1145   PROT 6.9 06/19/2009 1256   ALBUMIN 4.3 06/19/2009 1256   AST 20 06/19/2009 1256   ALT 12 06/19/2009 1256   ALKPHOS 62 06/19/2009 1256   BILITOT 0.9 06/19/2009 1256   GFRNONAA 71*  02/22/2013 1145   GFRAA 83* 02/22/2013 1145    Lipid Panel     Component Value Date/Time   CHOL 144 06/03/2009 2311   TRIG 96 06/03/2009 2311   HDL 61 06/03/2009 2311   CHOLHDL 2.4 Ratio 06/03/2009 2311   VLDL 19 06/03/2009 2311   LDLCALC 64 06/03/2009 2311     Lab Results  Component Value Date   HGBA1C 5.7 07/22/2009   HGBA1C 5.8 06/03/2009   HGBA1C 5.4 03/04/2009   Lab Results  Component Value Date   MICROALBUR 0.21 11/13/2008   LDLCALC 64 06/03/2009   CREATININE 0.85 02/22/2013     Diet recall: Breakfast: 1 scoop of raw fit powder, 1 small applet; Lunch: 2 Activia Light yogurts, 2 small clementines;  Dinner: 2 breaded fish filets, 1 cup collard greens, 2 spring rolls, 1 cup green beans, 1 cup cooked carrots, 1/2 cup summer squash, 1 strawberry popscile. Beverages consist of water only.   Nutrition Diagnosis: Involuntary weight gain r/t physical inactivity AEB 27# wt gain x 4 years.   Nutrition Intervention: Nutrition rx: 1500 kcal NAS, diabetic diet; 3 meals per day (45-60 grams carbohydrate per meal); limit snacks; low calorie beverages only; 30 minutes physical activity 5 times per week  Education/ counseling provided: Closely reviewed food diary with patient and discussed ways to help her achieve a healthier  lifestyle. Reemphasized dangers of a hypocaloric diet and emphasized importance of consuming at least 1200 kcals daily, in order to support and maintain healthy weight loss as well as support adequate nutrition. Reviewed portion sizes and discussed ideas for meals and snacks. Discussed importance of adhering to therapy recommendations. Reemphasized principles of healthy diet and keeping food diary. Encouraged pt to continue to increase physical activity as able. Encouraged pt to eat in an area free of distractions. Emphasized importance of making practical changes that pt will be able to follow long term to maintain weight loss. Provided emotional support. Praised pt for progress made.  Also discussed recommendations and ideas for breakfast lunch; encouraged pt to eat more during breakfast and lunch to prevent large appetite at dinner time. Teachback method used.  Understanding/Motivation/ Ability to follow recommendations: Expect fair to good compliance.   Monitoring and Evaluation: Previous Goals: 1) 1-2# wt loss per week- progressing; 2) 3 meals per day- progressing; 3) 30 minutess physical activity 5 times per week- progressing  Goals for next visit: 1) 1-2# wt loss per week; 2) 3 meals per day; 3) 30 minutes physical activity 5 times per week  Recommendations: 1) Consume no less than 1200 kcals daily; 2) Stay within confines of physical activity limitations per therapy  F/U: 4-6 weeks. F/U scheduled for Wednesday, 06/12/2013  Melody Haver, RD, LDN Date:05/15/2013 Appt EndTime: 1112

## 2013-05-16 ENCOUNTER — Ambulatory Visit (HOSPITAL_COMMUNITY)
Admission: RE | Admit: 2013-05-16 | Discharge: 2013-05-16 | Disposition: A | Discharge status: Home / Self Care | Payer: Medicare Other | Source: Ambulatory Visit | Attending: Family Medicine | Admitting: Family Medicine

## 2013-05-16 NOTE — Progress Notes (Signed)
Occupational Therapy Treatment Patient Details  Name: Felicia Wright MRN: [id number] Date of Birth: 1951-03-20  Today's Date: 05/16/2013 Time: 4098-1191 OT Time Calculation (min): 49 min MFR 1350-1405 15' Therex 4782-9562 34'   Visit#: 13 of 24  Re-eval: 05/30/13    Authorization: [employer]  Authorization Time Period: before 19th visit  Authorization Visit#: 13 of 19  Subjective Symptoms/Limitations Symptoms: S: I was doing my exercises with my weight and I am finally able to touch my headboard. I still can't touch it with the stick though.  Pain Assessment Currently in Pain?: No/denies  Precautions/Restrictions  Precautions Precautions: Shoulder  Exercise/Treatments Supine Protraction: PROM;5 reps;Strengthening;12 reps Protraction Weight (lbs): 1 Horizontal ABduction: PROM;5 reps;Strengthening;12 reps Horizontal ABduction Weight (lbs): 1 External Rotation: PROM;5 reps;Strengthening;12 reps External Rotation Weight (lbs): 1 Internal Rotation: PROM;5 reps;Strengthening;12 reps Internal Rotation Weight (lbs): 1 Flexion: PROM;5 reps;Strengthening;12 reps Shoulder Flexion Weight (lbs): 1 ABduction: PROM;5 reps;Strengthening;12 reps Shoulder ABduction Weight (lbs): 1 Seated Protraction: Strengthening;Weights;10 reps Protraction Weight (lbs): 1 Horizontal ABduction: Strengthening;Weights;10 reps Horizontal ABduction Weight (lbs): 1 Horizontal ABduction Limitations: vc's to depress shoulder External Rotation: Strengthening;Weights;10 reps External Rotation Weight (lbs): 1 Internal Rotation: Strengthening;Weights;10 reps Internal Rotation Weight (lbs): 1 Flexion: Strengthening;Weights;10 reps Flexion Weight (lbs): 1 Flexion Limitations: 50% range Abduction: AROM;10 reps;Limitations ABduction Limitations: 50% range Standing External Rotation: Theraband;10 reps Theraband Level (Shoulder External Rotation): Level 2 (Red) Internal Rotation: Theraband;10  reps Theraband Level (Shoulder Internal Rotation): Level 2 (Red) Extension: Theraband;10 reps Theraband Level (Shoulder Extension): Level 2 (Red) Row: Theraband;10 reps Theraband Level (Shoulder Row): Level 2 (Red) Retraction: Theraband;10 reps Theraband Level (Shoulder Retraction): Level 2 (Red) Therapy Ball Right/Left: 5 reps ROM / Strengthening / Isometric Strengthening UBE (Upper Arm Bike): 2.0 3' forward 3' reverse Proximal Shoulder Strengthening, Supine: 12X with 1# resistance      Manual Therapy Manual Therapy: Myofascial release Myofascial Release: Myofascial release and trigger point release to right scapular, trapezius, rhomboid region, upper arm, and shoulder region to decrease pain and fascial restrictions and increase pain free mobility in right shoulder  Occupational Therapy Assessment and Plan OT Assessment and Plan Clinical Impression Statement: A: Resumed theraband exercises. Added strengthening exercises seated. Patient able to complete all except ABD with weight.  OT Plan: P: Cont. to work on increase AROM during strengthening exercises performed seated and supine.    Goals Short Term Goals Short Term Goal 1: Patient will be educated on a HEP. Short Term Goal 2: Patient will increase PROM in right shoulder region to [facility] for increased ability to don deoderant. Short Term Goal 3: Patient will increase right shoulder strength to 3+/5 for increased ability to lift groceries onto countertop. Short Term Goal 4: Patient will decrease fascial restrictions to moderate in her right shoulder region for greater mobility needed for ADLs. Short Term Goal 5: Patient will decrease pain to 3/10 in her right shoulder region when donning and doffing shirts.  Long Term Goals Long Term Goal 1: Patient will return to prior level of independence with all B/IADLs, and leisure activities.  Long Term Goal 2: Patient will increase right shoulder AROM to [facility] for increased ability to reach  into overhead cabinets.  Long Term Goal 3: Patient will increase right shoulder strength to 4+/5 for increased ability to lift pots and pans.  Long Term Goal 4: Patient will decrease pain in her right shoulder to 1/10 when completing functional activities.  Long Term Goal 5: Patient will decrease fascial restrictions to minimal in her right shoulder  region.    Problem List Patient Active Problem List   Diagnosis Date Noted  . Pain in joint, shoulder region 04/02/2013  . Muscle weakness (generalized) 04/02/2013  . Shoulder arthritis 02/28/2013  . Breast calcifications on mammogram 02/16/2012  . KNEE, ARTHRITIS, DEGEN./OSTEO 05/28/2009  . MORBID OBESITY 01/09/2007  . DIABETES MELLITUS, TYPE II, CONTROLLED 12/27/2006  . FIBROIDS, UTERUS 10/10/2006  . HYPERLIPIDEMIA 10/10/2006  . CARPAL TUNNEL SYNDROME 10/10/2006  . CATARACT NOS 10/10/2006  . HYPERTENSION 10/10/2006  . ALLERGIC RHINITIS 10/10/2006  . ASTHMA 10/10/2006  . GERD 10/10/2006  . IBS 10/10/2006  . OVERACTIVE BLADDER 10/10/2006  . ARTHRITIS 10/10/2006    End of Session Activity Tolerance: Patient tolerated treatment well General Behavior During Therapy: [facility] for tasks assessed/performed Cognition: [facility] for tasks performed   Limmie Patricia, OTR/L,CBIS   05/16/2013, 2:38 PM

## 2013-05-21 ENCOUNTER — Ambulatory Visit (HOSPITAL_COMMUNITY)
Admission: RE | Admit: 2013-05-21 | Discharge: 2013-05-21 | Disposition: A | Discharge status: Home / Self Care | Payer: Medicare Other | Source: Ambulatory Visit | Attending: Family Medicine | Admitting: Family Medicine

## 2013-05-21 DIAGNOSIS — M25511 Pain in right shoulder: Secondary | ICD-10-CM

## 2013-05-21 DIAGNOSIS — M6281 Muscle weakness (generalized): Secondary | ICD-10-CM

## 2013-05-21 NOTE — Progress Notes (Signed)
Occupational Therapy Treatment Patient Details  Name: Felicia Wright MRN: [id number] Date of Birth: 12-Jul-1951  Today's Date: 05/21/2013 Time: 7829-5621 OT Time Calculation (min): 45 min MFR 1350-1410 20' Therex 3086-5784 25'  Visit#: 14 of 24  Re-eval: 05/30/13    Authorization: [employer]  Authorization Time Period: before 19th visit  Authorization Visit#: 14 of 19  Subjective Symptoms/Limitations Symptoms: S: I wasn't feeling well this weekend so I didn't do my exercises like I usually do.  Pain Assessment Currently in Pain?: No/denies  Precautions/Restrictions  Precautions Precautions: Shoulder  Exercise/Treatments Supine Protraction: PROM;5 reps;Strengthening;12 reps Protraction Weight (lbs): 1 Horizontal ABduction: PROM;5 reps;Strengthening;12 reps Horizontal ABduction Weight (lbs): 1 External Rotation: PROM;5 reps;Strengthening;12 reps External Rotation Weight (lbs): 1 Internal Rotation: PROM;5 reps;Strengthening;12 reps Internal Rotation Weight (lbs): 1 Flexion: PROM;5 reps;Strengthening;12 reps Shoulder Flexion Weight (lbs): 1 ABduction: PROM;5 reps;Strengthening;12 reps Shoulder ABduction Weight (lbs): 1 Seated Protraction: Strengthening;Weights;12 reps Protraction Weight (lbs): 1 Horizontal ABduction: Strengthening;Weights;12 reps Horizontal ABduction Weight (lbs): 1 External Rotation: Strengthening;Weights;12 reps External Rotation Weight (lbs): 1 Internal Rotation: Strengthening;Weights;12 reps Internal Rotation Weight (lbs): 1 Flexion: Strengthening;Weights;12 reps Flexion Weight (lbs): 1 Flexion Limitations: 50% range Abduction: AROM;Limitations;12 reps ABduction Limitations: 50% range Standing External Rotation: Theraband;10 reps Theraband Level (Shoulder External Rotation): Level 2 (Red) Extension: Theraband;10 reps Theraband Level (Shoulder Extension): Level 2 (Red) Row: Theraband;10 reps Theraband Level (Shoulder Row):  Level 2 (Red) Retraction: Theraband;10 reps Theraband Level (Shoulder Retraction): Level 2 (Red) ROM / Strengthening / Isometric Strengthening UBE (Upper Arm Bike): 2.0 3' forward 3' reverse Wall [place]: 2' Proximal Shoulder Strengthening, Supine: 12X with 1# resistance       Manual Therapy Manual Therapy: Myofascial release Myofascial Release: Myofascial release and trigger point release to right scapular, trapezius, rhomboid region, upper arm, and shoulder region to decrease pain and fascial restrictions and increase pain free mobility in right shoulder  Occupational Therapy Assessment and Plan OT Assessment and Plan Clinical Impression Statement: A: Increase repetitions with seated Strengthening exercises. Continues to have difficulty performing ABD with weight.  OT Plan: P: Cont. to work on increase AROM during strengthening exercises performed seated and supine.    Goals Short Term Goals Short Term Goal 1: Patient will be educated on a HEP. Short Term Goal 2: Patient will increase PROM in right shoulder region to [facility] for increased ability to don deoderant. Short Term Goal 3: Patient will increase right shoulder strength to 3+/5 for increased ability to lift groceries onto countertop. Short Term Goal 3 Progress: Progressing toward goal Short Term Goal 4: Patient will decrease fascial restrictions to moderate in her right shoulder region for greater mobility needed for ADLs. Short Term Goal 5: Patient will decrease pain to 3/10 in her right shoulder region when donning and doffing shirts.  Long Term Goals Long Term Goal 1: Patient will return to prior level of independence with all B/IADLs, and leisure activities.  Long Term Goal 1 Progress: Progressing toward goal Long Term Goal 2: Patient will increase right shoulder AROM to [facility] for increased ability to reach into overhead cabinets.  Long Term Goal 2 Progress: Progressing toward goal Long Term Goal 3: Patient will increase right  shoulder strength to 4+/5 for increased ability to lift pots and pans.  Long Term Goal 3 Progress: Progressing toward goal Long Term Goal 4: Patient will decrease pain in her right shoulder to 1/10 when completing functional activities.  Long Term Goal 4 Progress: Progressing toward goal Long Term Goal 5: Patient will decrease fascial  restrictions to minimal in her right shoulder region.   Long Term Goal 5 Progress: Progressing toward goal  Problem List Patient Active Problem List   Diagnosis Date Noted  . Pain in joint, shoulder region 04/02/2013  . Muscle weakness (generalized) 04/02/2013  . Shoulder arthritis 02/28/2013  . Breast calcifications on mammogram 02/16/2012  . KNEE, ARTHRITIS, DEGEN./OSTEO 05/28/2009  . MORBID OBESITY 01/09/2007  . DIABETES MELLITUS, TYPE II, CONTROLLED 12/27/2006  . FIBROIDS, UTERUS 10/10/2006  . HYPERLIPIDEMIA 10/10/2006  . CARPAL TUNNEL SYNDROME 10/10/2006  . CATARACT NOS 10/10/2006  . HYPERTENSION 10/10/2006  . ALLERGIC RHINITIS 10/10/2006  . ASTHMA 10/10/2006  . GERD 10/10/2006  . IBS 10/10/2006  . OVERACTIVE BLADDER 10/10/2006  . ARTHRITIS 10/10/2006    End of Session Activity Tolerance: Patient tolerated treatment well General Behavior During Therapy: [facility] for tasks assessed/performed Cognition: [facility] for tasks performed   Limmie Patricia, OTR/L,CBIS   05/21/2013, 2:34 PM

## 2013-05-23 ENCOUNTER — Ambulatory Visit (HOSPITAL_COMMUNITY)
Admission: RE | Admit: 2013-05-23 | Discharge: 2013-05-23 | Disposition: A | Discharge status: Home / Self Care | Payer: Medicare Other | Source: Ambulatory Visit | Attending: Family Medicine | Admitting: Family Medicine

## 2013-05-23 DIAGNOSIS — M25511 Pain in right shoulder: Secondary | ICD-10-CM

## 2013-05-23 DIAGNOSIS — M6281 Muscle weakness (generalized): Secondary | ICD-10-CM

## 2013-05-23 NOTE — Progress Notes (Signed)
Occupational Therapy Treatment Patient Details  Name: Felicia Wright MRN: [id number] Date of Birth: 02/14/1951  Today's Date: 05/23/2013 Time: 4098-1191 OT Time Calculation (min): 43 min MFR 1347-1400 13' Therex 1400-1430 30'  Visit#: 15 of 24  Re-eval: 05/30/13    Authorization: [employer]  Authorization Time Period: before 19th visit  Authorization Visit#: 15 of 19  Subjective Symptoms/Limitations Symptoms: S: My shoulder hasn't been hurting me. It's feeling good. I have been doing my exercises sitting in a chair with the weight. The only one I can't do with the weight is the jumping jack. Pain Assessment Currently in Pain?: No/denies  Precautions/Restrictions  Precautions Precautions: Shoulder  Exercise/Treatments Supine Protraction: PROM;5 reps;Strengthening;12 reps Protraction Weight (lbs): 1 Horizontal ABduction: PROM;5 reps;Strengthening;12 reps Horizontal ABduction Weight (lbs): 1 External Rotation: PROM;5 reps;Strengthening;12 reps External Rotation Weight (lbs): 1 Internal Rotation: PROM;5 reps;Strengthening;12 reps Internal Rotation Weight (lbs): 1 Flexion: PROM;5 reps;Strengthening;12 reps Shoulder Flexion Weight (lbs): 1 ABduction: PROM;5 reps;Strengthening;12 reps Shoulder ABduction Weight (lbs): 1 Seated Protraction: Strengthening;Weights;12 reps Protraction Weight (lbs): 1 Horizontal ABduction: Strengthening;Weights;12 reps Horizontal ABduction Weight (lbs): 1 External Rotation: Strengthening;Weights;12 reps External Rotation Weight (lbs): 1 Internal Rotation: Strengthening;Weights;12 reps Internal Rotation Weight (lbs): 1 Flexion: Strengthening;Weights;12 reps Flexion Weight (lbs): 1 Flexion Limitations: 60% range Abduction: AROM;Limitations;12 reps ABduction Limitations: 60% range ROM / Strengthening / Isometric Strengthening UBE (Upper Arm Bike): 2.0 3' forward 3' reverse Over Head Lace: 2' Proximal Shoulder Strengthening,  Supine: 12X with 1# resistance      Manual Therapy Manual Therapy: Myofascial release Myofascial Release: Myofascial release and trigger point release to right scapular, trapezius, rhomboid region, upper arm, and shoulder region to decrease pain and fascial restrictions and increase pain free mobility in right shoulder.  Occupational Therapy Assessment and Plan OT Assessment and Plan Clinical Impression Statement: A: Added overhead lacing. Tolerated well. New scar tissue released this date during manual stretching.  OT Plan: P: Attempt Cybex Rox/Press   Goals Short Term Goals Short Term Goal 1: Patient will be educated on a HEP. Short Term Goal 2: Patient will increase PROM in right shoulder region to [facility] for increased ability to don deoderant. Short Term Goal 3: Patient will increase right shoulder strength to 3+/5 for increased ability to lift groceries onto countertop. Short Term Goal 4: Patient will decrease fascial restrictions to moderate in her right shoulder region for greater mobility needed for ADLs. Short Term Goal 5: Patient will decrease pain to 3/10 in her right shoulder region when donning and doffing shirts.  Long Term Goals Long Term Goal 1: Patient will return to prior level of independence with all B/IADLs, and leisure activities.  Long Term Goal 2: Patient will increase right shoulder AROM to [facility] for increased ability to reach into overhead cabinets.  Long Term Goal 3: Patient will increase right shoulder strength to 4+/5 for increased ability to lift pots and pans.  Long Term Goal 4: Patient will decrease pain in her right shoulder to 1/10 when completing functional activities.  Long Term Goal 5: Patient will decrease fascial restrictions to minimal in her right shoulder region.    Problem List Patient Active Problem List   Diagnosis Date Noted  . Pain in joint, shoulder region 04/02/2013  . Muscle weakness (generalized) 04/02/2013  . Shoulder arthritis 02/28/2013   . Breast calcifications on mammogram 02/16/2012  . KNEE, ARTHRITIS, DEGEN./OSTEO 05/28/2009  . MORBID OBESITY 01/09/2007  . DIABETES MELLITUS, TYPE II, CONTROLLED 12/27/2006  . FIBROIDS, UTERUS 10/10/2006  . HYPERLIPIDEMIA 10/10/2006  .  CARPAL TUNNEL SYNDROME 10/10/2006  . CATARACT NOS 10/10/2006  . HYPERTENSION 10/10/2006  . ALLERGIC RHINITIS 10/10/2006  . ASTHMA 10/10/2006  . GERD 10/10/2006  . IBS 10/10/2006  . OVERACTIVE BLADDER 10/10/2006  . ARTHRITIS 10/10/2006    End of Session Activity Tolerance: Patient tolerated treatment well General Behavior During Therapy: [facility] for tasks assessed/performed Cognition: [facility] for tasks performed   Limmie Patricia, OTR/L,CBIS   05/23/2013, 2:26 PM

## 2013-05-27 ENCOUNTER — Ambulatory Visit (HOSPITAL_COMMUNITY)
Admission: RE | Admit: 2013-05-27 | Discharge: 2013-05-27 | Disposition: A | Discharge status: Home / Self Care | Payer: Medicare Other | Source: Ambulatory Visit | Attending: Family Medicine | Admitting: Family Medicine

## 2013-05-27 DIAGNOSIS — M6281 Muscle weakness (generalized): Secondary | ICD-10-CM

## 2013-05-27 DIAGNOSIS — M25511 Pain in right shoulder: Secondary | ICD-10-CM

## 2013-05-27 NOTE — Progress Notes (Signed)
Occupational Therapy Treatment Patient Details  Name: Felicia Wright MRN: [id number] Date of Birth: 22-Nov-1950  Today's Date: 05/27/2013 Time: 4098-1191 OT Time Calculation (min): 48 min MFR 1350-1402 12' Therex 4782-9562 36'  Visit#: 16 of 24  Re-eval: 05/30/13    Authorization: [employer]  Authorization Time Period: before 19th visit  Authorization Visit#: 16 of 19  Subjective Symptoms/Limitations Symptoms: S: My shoulder was sore earlier. I was doing my theraband exercises and I may have overstretched my arm a little too much.  Pain Assessment Currently in Pain?: No/denies  Precautions/Restrictions  Precautions Precautions: Shoulder  Exercise/Treatments Supine Protraction: PROM;5 reps;Strengthening;12 reps Protraction Weight (lbs): 1 Horizontal ABduction: PROM;5 reps;Strengthening;12 reps Horizontal ABduction Weight (lbs): 1 External Rotation: PROM;5 reps;Strengthening;12 reps External Rotation Weight (lbs): 1 Internal Rotation: PROM;5 reps;Strengthening;12 reps Internal Rotation Weight (lbs): 1 Flexion: PROM;5 reps;Strengthening;12 reps Shoulder Flexion Weight (lbs): 1 ABduction: PROM;5 reps;Strengthening;12 reps Shoulder ABduction Weight (lbs): 1 Seated Protraction: Strengthening;Weights;12 reps Protraction Weight (lbs): 1 Horizontal ABduction: Strengthening;Weights;12 reps Horizontal ABduction Weight (lbs): 1 External Rotation: Strengthening;Weights;12 reps External Rotation Weight (lbs): 1 Internal Rotation: Strengthening;Weights;12 reps Internal Rotation Weight (lbs): 1 Flexion: Strengthening;Weights;12 reps Flexion Weight (lbs): 1 Flexion Limitations: 60% range Abduction: AROM;Limitations;12 reps ABduction Limitations: 60% range ROM / Strengthening / Isometric Strengthening UBE (Upper Arm Bike): 2.0 3' forward 3' reverse Cybex Press: 2 plate;20 reps Cybex Row: 1.5 plate;20 reps Wall Wash: 2' Proximal Shoulder Strengthening, Supine:  12X with 1# resistance Proximal Shoulder Strengthening, Seated: 12X with 1# rest breaks taken      Manual Therapy Manual Therapy: Myofascial release Myofascial Release: Myofascial release and trigger point release to right scapular, trapezius, rhomboid region, upper arm, and shoulder region to decrease pain and fascial restrictions and increase pain free mobility in right shoulder.  Occupational Therapy Assessment and Plan OT Assessment and Plan Clinical Impression Statement: A: Added cybex row/press. Tolerated well.  OT Plan: P: reassess and D/C with HEP.   Goals Short Term Goals Short Term Goal 1: Patient will be educated on a HEP. Short Term Goal 2: Patient will increase PROM in right shoulder region to [facility] for increased ability to don deoderant. Short Term Goal 3: Patient will increase right shoulder strength to 3+/5 for increased ability to lift groceries onto countertop. Short Term Goal 3 Progress: Progressing toward goal Short Term Goal 4: Patient will decrease fascial restrictions to moderate in her right shoulder region for greater mobility needed for ADLs. Short Term Goal 5: Patient will decrease pain to 3/10 in her right shoulder region when donning and doffing shirts.  Long Term Goals Long Term Goal 1: Patient will return to prior level of independence with all B/IADLs, and leisure activities.  Long Term Goal 1 Progress: Progressing toward goal Long Term Goal 2: Patient will increase right shoulder AROM to [facility] for increased ability to reach into overhead cabinets.  Long Term Goal 2 Progress: Progressing toward goal Long Term Goal 3: Patient will increase right shoulder strength to 4+/5 for increased ability to lift pots and pans.  Long Term Goal 3 Progress: Progressing toward goal Long Term Goal 4: Patient will decrease pain in her right shoulder to 1/10 when completing functional activities.  Long Term Goal 4 Progress: Progressing toward goal Long Term Goal 5: Patient will  decrease fascial restrictions to minimal in her right shoulder region.   Long Term Goal 5 Progress: Progressing toward goal  Problem List Patient Active Problem List   Diagnosis Date Noted  . Pain in joint, shoulder  region 04/02/2013  . Muscle weakness (generalized) 04/02/2013  . Shoulder arthritis 02/28/2013  . Breast calcifications on mammogram 02/16/2012  . KNEE, ARTHRITIS, DEGEN./OSTEO 05/28/2009  . MORBID OBESITY 01/09/2007  . DIABETES MELLITUS, TYPE II, CONTROLLED 12/27/2006  . FIBROIDS, UTERUS 10/10/2006  . HYPERLIPIDEMIA 10/10/2006  . CARPAL TUNNEL SYNDROME 10/10/2006  . CATARACT NOS 10/10/2006  . HYPERTENSION 10/10/2006  . ALLERGIC RHINITIS 10/10/2006  . ASTHMA 10/10/2006  . GERD 10/10/2006  . IBS 10/10/2006  . OVERACTIVE BLADDER 10/10/2006  . ARTHRITIS 10/10/2006    End of Session Activity Tolerance: Patient tolerated treatment well General Behavior During Therapy: [facility] for tasks assessed/performed Cognition: [facility] for tasks performed   Limmie Patricia, OTR/L,CBIS   05/27/2013, 2:33 PM

## 2013-05-30 ENCOUNTER — Ambulatory Visit (HOSPITAL_COMMUNITY)
Admission: RE | Admit: 2013-05-30 | Discharge: 2013-05-30 | Disposition: A | Discharge status: Home / Self Care | Payer: Medicare Other | Source: Ambulatory Visit | Attending: Family Medicine | Admitting: Family Medicine

## 2013-05-30 DIAGNOSIS — M25511 Pain in right shoulder: Secondary | ICD-10-CM

## 2013-05-30 DIAGNOSIS — M6281 Muscle weakness (generalized): Secondary | ICD-10-CM

## 2013-05-30 NOTE — Evaluation (Signed)
Occupational Therapy Discharge Summary  Patient Details  Name: Felicia Wright MRN: [id number] Date of Birth: 1951/09/29  Today's Date: 05/30/2013 Time: 4098-1191 OT Time Calculation (min): 42 min MFR 1348-1410 22' Reassess 1410-1430 20'  Visit#: 17 of 24  Re-eval: 05/30/13  Assessment Diagnosis: S/P Right Total Shoulder Replacement  Authorization: [employer]  Authorization Time Period: before 19th visit  Authorization Visit#: 17 of 19   Past Medical History:  Past Medical History  Diagnosis Date  . Normocytic anemia   . Malaise and fatigue   . Oral candidiasis   . Contusion of second toe, right   . Cystitis   . Accidental fall   . Aphthous ulcer   . Hip pain, left   . Preventative health care   . Encounter for long-term (current) use of other medications   . Reactive depression (situational)   . Morbid obesity   . Sleep apnea   . Fibroid uterus   . Cataract   . Arthritis   . Incontinence   . Constipation   . IBS (irritable bowel syndrome)   . Bronchitis   . Abnormal heart rhythm   . Seizure disorder   . Hypertension   . Hyperlipidemia   . GERD (gastroesophageal reflux disease)   . Depression   . Asthma   . Allergic rhinitis   . Diabetes mellitus type II     diet controlled  . Ulnar neuropathy     Left  . Carpal tunnel syndrome   . Overactive bladder    Past Surgical History:  Past Surgical History  Procedure Laterality Date  . Nasal sinus surgery  1983  . Tonsillectomy    . Hemorroidectomy    . Heel spur surgery      Right  . Carpal tunnel release      Right  . Cholecystectomy    . Knee arthroscopy      Right  . Dilation and curettage of uterus    . Breast biopsy  03/07/2012    Procedure: BREAST BIOPSY WITH NEEDLE LOCALIZATION;  Surgeon: Shelly Rubenstein, MD;  Location: [facility];  Service: General;  Laterality: Right;  needle localized right breast lumpectomy  . Total shoulder arthroplasty Right 02/28/2013   Procedure: RIGHT TOTAL SHOULDER ARTHROPLASTY;  Surgeon: Senaida Lange, MD;  Location: [facility] OR;  Service: Orthopedics;  Laterality: Right;    Subjective Symptoms/Limitations Symptoms: S: I have been doing everything at home now. I can comb my hair, vacuum, and I'm cooking my meals as well. Pain Assessment Currently in Pain?: No/denies  Precautions/Restrictions  Precautions Precautions: Shoulder   Assessment  Additional Assessments RUE Assessment RUE Assessment:  (assessed supine/seated) RUE AROM (degrees) Right Shoulder Flexion:  (158/121 (last progress note: 140 supine)) Right Shoulder ABduction:  (150/120 (last progress note: 150 supine)) Right Shoulder Internal Rotation:  (90/90 (last progress note: 85 supine)) Right Shoulder External Rotation:  (72/50 (Last progress note: 12 supine)) RUE Strength Right Shoulder Flexion:  (4+/5) Right Shoulder ABduction:  (4+/5) Right Shoulder Internal Rotation:  (4+/5) Right Shoulder External Rotation:  (4+/5) Palpation Palpation: Zero fascial restrictions in right upper arm, trapezius, and scapularis. region.       Occupational Therapy Assessment and Plan OT Assessment and Plan Clinical Impression Statement: A; patient has met all goals. See MD note for progress. OT Plan: P: D/C from therapy.   Goals Short Term Goals Short Term Goal 1: Patient will be educated on a HEP. Short Term Goal 2: Patient will increase  PROM in right shoulder region to [facility] for increased ability to don deoderant. Short Term Goal 3: Patient will increase right shoulder strength to 3+/5 for increased ability to lift groceries onto countertop. Short Term Goal 3 Progress: Met Short Term Goal 4: Patient will decrease fascial restrictions to moderate in her right shoulder region for greater mobility needed for ADLs. Short Term Goal 5: Patient will decrease pain to 3/10 in her right shoulder region when donning and doffing shirts.  Long Term Goals Long Term Goal 1:  Patient will return to prior level of independence with all B/IADLs, and leisure activities.  Long Term Goal 1 Progress: Met Long Term Goal 2: Patient will increase right shoulder AROM to [facility] for increased ability to reach into overhead cabinets.  Long Term Goal 2 Progress: Met Long Term Goal 3: Patient will increase right shoulder strength to 4+/5 for increased ability to lift pots and pans.  Long Term Goal 3 Progress: Met Long Term Goal 4: Patient will decrease pain in her right shoulder to 1/10 when completing functional activities.  Long Term Goal 4 Progress: Met Long Term Goal 5: Patient will decrease fascial restrictions to minimal in her right shoulder region.   Long Term Goal 5 Progress: Met  Problem List Patient Active Problem List   Diagnosis Date Noted  . Pain in joint, shoulder region 04/02/2013  . Muscle weakness (generalized) 04/02/2013  . Shoulder arthritis 02/28/2013  . Breast calcifications on mammogram 02/16/2012  . KNEE, ARTHRITIS, DEGEN./OSTEO 05/28/2009  . MORBID OBESITY 01/09/2007  . DIABETES MELLITUS, TYPE II, CONTROLLED 12/27/2006  . FIBROIDS, UTERUS 10/10/2006  . HYPERLIPIDEMIA 10/10/2006  . CARPAL TUNNEL SYNDROME 10/10/2006  . CATARACT NOS 10/10/2006  . HYPERTENSION 10/10/2006  . ALLERGIC RHINITIS 10/10/2006  . ASTHMA 10/10/2006  . GERD 10/10/2006  . IBS 10/10/2006  . OVERACTIVE BLADDER 10/10/2006  . ARTHRITIS 10/10/2006    End of Session Activity Tolerance: Patient tolerated treatment well General Behavior During Therapy: WFL for tasks assessed/performed Cognition: WFL for tasks performed  GO Functional Assessment Tool Used: DASH score of 2.2 with ideal score of 0. Functional Limitation: Self care Self Care Current Status [phone]): At least 1 percent but less than 20 percent impaired, limited or restricted Self Care Goal Status ([id number]): At least 1 percent but less than 20 percent impaired, limited or restricted Self Care Discharge Status [phone]):  At least 1 percent but less than 20 percent impaired, limited or restricted  Limmie Patricia, OTR/L,CBIS   05/30/2013, 2:57 PM  Physician Documentation Your signature is required to indicate approval of the treatment plan as stated above.  Please sign and either send electronically or make a copy of this report for your files and return this physician signed original.  Please mark one 1.__approve of plan  2. ___approve of plan with the following conditions.   ______________________________                                                          _____________________ Physician Signature  Date  

## 2013-06-12 ENCOUNTER — Encounter (HOSPITAL_COMMUNITY): Payer: Self-pay | Admitting: Dietician

## 2013-06-12 NOTE — Progress Notes (Signed)
Follow-Up Outpatient Nutrition Note Date: 06/12/2013  Appt Start Time: 1012 Nutrition Assessment:  Current weight: Weight: 265 lb (120.203 kg)  BMI: Body mass index is 42.79 kg/(m^2).  Weight changes: -4# (1.5%) x 1 month  Felicia Wright confides that she is feeling depressed. She has a flat affect today; she is not as cheerful as her usual self. She reports she wants to take some time to "slow down" after her surgery. She reports that she just finished physical therapy on 05/30/13. She states "I've been on the move for 3 months. I just want some time to myself. People are always calling me and ask me how I'm doing. Sometimes I pick up the phone, other times I don't". She became upset when she recollected her last appointment with Dr. Janna Arch last month; she states "He told me, 'I need to move, I need to move'. I just need time to slow down. [facility]". She became very tearful at the visit, stating that it is has been very hard to adjust to her new life after surgery and is having difficulty processing all that has happened in the past 3 months. She is still upset that it will take 6 months-1 year for her shoulder to completely heal.  She states "I'm a carb addict". She reports that she has cut out all carb in her diet and is eating mainly protein, vegetables, and fruit. Diet recall suggests that simple carbohydrates are eaten less frequently and she is choosing more nutrient dense foods. As of 10 days ago, she has eliminated potatoes and potato chips from her diet, as those are weakness. She continues to eat out once a week at [employer]. She is concerned about fluid retention, but unsure how she is getting sodium in he diet, as she does not add salt to her foods.  She has stopped her Raw Fit protein powder due to expense. She purchased "Slim Quick Pure" and wanted to know this RD's opinion of it- she wants something to help her metabolism.  She continues to keep a detailed food diary;  daily calorie content ranges from [phone] kcals per day, trending downward in the past 10 days. CBGs still WNL, ranging from 95-129.   Labs: CMP     Component Value Date/Time   NA 132* 02/22/2013 1145   K 4.2 02/22/2013 1145   CL 98 02/22/2013 1145   CO2 26 02/22/2013 1145   GLUCOSE 86 02/22/2013 1145   BUN 20 02/22/2013 1145   CREATININE 0.85 02/22/2013 1145   CALCIUM 10.0 02/22/2013 1145   PROT 6.9 06/19/2009 1256   ALBUMIN 4.3 06/19/2009 1256   AST 20 06/19/2009 1256   ALT 12 06/19/2009 1256   ALKPHOS 62 06/19/2009 1256   BILITOT 0.9 06/19/2009 1256   GFRNONAA 71* 02/22/2013 1145   GFRAA 83* 02/22/2013 1145    Lipid Panel     Component Value Date/Time   CHOL 144 06/03/2009 2311   TRIG 96 06/03/2009 2311   HDL 61 06/03/2009 2311   CHOLHDL 2.4 Ratio 06/03/2009 2311   VLDL 19 06/03/2009 2311   LDLCALC 64 06/03/2009 2311     Lab Results  Component Value Date   HGBA1C 5.7 07/22/2009   HGBA1C 5.8 06/03/2009   HGBA1C 5.4 03/04/2009   Lab Results  Component Value Date   MICROALBUR 0.21 11/13/2008   LDLCALC 64 06/03/2009   CREATININE 0.85 02/22/2013     Diet recall: Breakfast: 1/2 cup oatmeal, 1tbs smart  balance margarinet; Lunch: 2 Activia Light yogurts, 1 small apple, 2 cheddar rice cakes;  Dinner: 1 can of tuna, 1 cup onions, 1 cup broccoli, 2 rice cakes. Beverages consist of water, diet green tea, and [employer] lemonade.   Nutrition Diagnosis: Involuntary weight gain r/t physical inactivity AEB 27# wt gain x 4 years.   Nutrition Intervention: Nutrition rx: 1500 kcal NAS, diabetic diet; 3 meals per day (45-60 grams carbohydrate per meal); limit snacks; low calorie beverages only; 30 minutes physical activity 5 times per week  Education/ counseling provided: Reviewed food diary with patient and discussed ways to help her achieve a healthier lifestyle. Reemphasized dangers of a hypocaloric diet and emphasized importance of consuming at least 1200 kcals daily, in order to support and maintain  healthy weight loss as well as support adequate nutrition. Discussed importance of adhering to therapy recommendations. Reemphasized principles of healthy diet and keeping food diary. Encouraged pt to continue to increase physical activity as able. Encouraged pt to eat in an area free of distractions. Emphasized importance of making practical changes that pt will be able to follow long term to maintain weight loss. Provided emotional support. Praised pt for progress made. Discouraged used of diet supplements, due to danger of drug interactions; encouraged weight loss through healthful behavior changes. Encouraged pt to express her feelings and engage in activities that provided enjoyment and socialization. Teachback method used.  Understanding/Motivation/ Ability to follow recommendations: Expect fair to good compliance.   Monitoring and Evaluation: Previous Goals: 1) 1-2# wt loss per week- goal met; 2) 3 meals per day- goal met; 3) 30 minutes physical activity 5 times per week- progressing  Goals for next visit: 1) 1-2# wt loss per week; 2) 30 minutes physical activity 5 times per week; 3) Consume no less than 1200 kcals per day  Recommendations: 1) Break up physical activity into smaller, more frequent sessions; 2) Make time for yourself every day; 3) Find an exercise that is enjoyable to you; 4) Continue to participate in social activity with friends that support you  F/U: 4-6 weeks. F/U scheduled for Wednesday, 07/24/2013  Hudson Majkowski A. Mayford Knife, RD, LDN Date:06/12/2013 Appt EndTime: 1057

## 2013-07-17 ENCOUNTER — Telehealth (HOSPITAL_COMMUNITY): Payer: Self-pay | Admitting: Dietician

## 2013-07-17 NOTE — Telephone Encounter (Signed)
Received call from pt at 1244. She requests cancelling appointment on 07/24/16 due to conflict. She reports she will back when she is ready to reschedule.

## 2014-01-13 ENCOUNTER — Other Ambulatory Visit (HOSPITAL_COMMUNITY): Payer: Self-pay | Admitting: Family Medicine

## 2014-01-13 DIAGNOSIS — Z1231 Encounter for screening mammogram for malignant neoplasm of breast: Secondary | ICD-10-CM

## 2014-01-17 ENCOUNTER — Ambulatory Visit (HOSPITAL_COMMUNITY): Payer: Medicare Other

## 2014-01-30 ENCOUNTER — Ambulatory Visit (HOSPITAL_COMMUNITY)
Admission: RE | Admit: 2014-01-30 | Discharge: 2014-01-30 | Disposition: A | Discharge status: Home / Self Care | Payer: Medicare Other | Source: Ambulatory Visit | Attending: Family Medicine | Admitting: Family Medicine

## 2014-01-30 DIAGNOSIS — Z1231 Encounter for screening mammogram for malignant neoplasm of breast: Secondary | ICD-10-CM | POA: Insufficient documentation

## 2015-04-09 ENCOUNTER — Other Ambulatory Visit (HOSPITAL_COMMUNITY): Payer: Self-pay | Admitting: Family Medicine

## 2015-04-09 DIAGNOSIS — Z1231 Encounter for screening mammogram for malignant neoplasm of breast: Secondary | ICD-10-CM

## 2015-04-16 ENCOUNTER — Ambulatory Visit (HOSPITAL_COMMUNITY)
Admission: RE | Admit: 2015-04-16 | Discharge: 2015-04-16 | Disposition: A | Discharge status: Home / Self Care | Payer: Medicare Other | Source: Ambulatory Visit | Attending: Family Medicine | Admitting: Family Medicine

## 2015-04-16 ENCOUNTER — Other Ambulatory Visit (HOSPITAL_COMMUNITY): Payer: Self-pay | Admitting: Family Medicine

## 2015-04-16 DIAGNOSIS — Z1231 Encounter for screening mammogram for malignant neoplasm of breast: Secondary | ICD-10-CM

## 2015-04-22 ENCOUNTER — Other Ambulatory Visit: Payer: Self-pay | Admitting: Family Medicine

## 2015-04-22 DIAGNOSIS — R928 Other abnormal and inconclusive findings on diagnostic imaging of breast: Secondary | ICD-10-CM

## 2015-04-29 ENCOUNTER — Ambulatory Visit
Admission: RE | Admit: 2015-04-29 | Discharge: 2015-04-29 | Disposition: A | Discharge status: Home / Self Care | Payer: Medicare Other | Source: Ambulatory Visit | Attending: Family Medicine | Admitting: Family Medicine

## 2015-04-29 DIAGNOSIS — R928 Other abnormal and inconclusive findings on diagnostic imaging of breast: Secondary | ICD-10-CM

## 2015-05-26 ENCOUNTER — Encounter (INDEPENDENT_AMBULATORY_CARE_PROVIDER_SITE_OTHER): Payer: Self-pay | Admitting: *Deleted

## 2015-06-03 ENCOUNTER — Encounter (INDEPENDENT_AMBULATORY_CARE_PROVIDER_SITE_OTHER): Payer: Self-pay | Admitting: *Deleted

## 2015-06-03 ENCOUNTER — Other Ambulatory Visit (INDEPENDENT_AMBULATORY_CARE_PROVIDER_SITE_OTHER): Payer: Self-pay | Admitting: *Deleted

## 2015-06-03 DIAGNOSIS — Z1211 Encounter for screening for malignant neoplasm of colon: Secondary | ICD-10-CM

## 2015-07-09 ENCOUNTER — Telehealth (INDEPENDENT_AMBULATORY_CARE_PROVIDER_SITE_OTHER): Payer: Self-pay | Admitting: *Deleted

## 2015-07-09 MED ORDER — SUPREP BOWEL PREP KIT 17.5-3.13-1.6 GM/177ML PO SOLN: 1.0000 | Freq: Once | ORAL | Status: DC

## 2015-07-09 NOTE — Telephone Encounter (Signed)
Patient needs suprep 

## 2015-07-17 ENCOUNTER — Telehealth (INDEPENDENT_AMBULATORY_CARE_PROVIDER_SITE_OTHER): Payer: Self-pay | Admitting: *Deleted

## 2015-07-17 NOTE — Telephone Encounter (Signed)
Referring MD/PCP: [email or web address]   Procedure: tcs  Reason/Indication:  screening  Has patient had this procedure before?  Yes, 10 yrs ago  If so, when, by whom and where?    Is there a family history of colon cancer?  no  Who?  What age when diagnosed?    Is patient diabetic?   Yes -- diet control      Does patient have prosthetic heart valve?  no  Do you have a pacemaker?  no  Has patient ever had endocarditis? no  Has patient had joint replacement within last 12 months?  no  Does patient tend to be constipated or take laxatives? no  Does patient have a history of alcohol/drug use? no  Is patient on Coumadin, Plavix and/or Aspirin? yes  Medications: asa 81 mg bid, celecoxib 250 mg daily, omeprazole 20 mg daily, pravastatin 40 mg daily, lisinopril 20 mg daily, multi vit daily, magnesium 500 mg daily, benadryl bid  Allergies: zoloft, sulfur antibiotics  Medication Adjustment: asa 2 days  Procedure date & time: 08/12/15 at 955

## 2015-07-21 NOTE — Telephone Encounter (Signed)
agree

## 2015-08-12 ENCOUNTER — Other Ambulatory Visit: Payer: Self-pay

## 2015-08-12 ENCOUNTER — Ambulatory Visit (HOSPITAL_COMMUNITY)
Admission: RE | Admit: 2015-08-12 | Discharge: 2015-08-12 | Disposition: A | Discharge status: Home / Self Care | Payer: Medicare Other | Source: Ambulatory Visit | Attending: Internal Medicine | Admitting: Internal Medicine

## 2015-08-12 ENCOUNTER — Encounter (HOSPITAL_COMMUNITY): Payer: Self-pay | Admitting: *Deleted

## 2015-08-12 ENCOUNTER — Encounter (HOSPITAL_COMMUNITY)
Admission: RE | Disposition: A | Discharge status: Home / Self Care | Payer: Self-pay | Source: Ambulatory Visit | Attending: Internal Medicine

## 2015-08-12 DIAGNOSIS — Z1211 Encounter for screening for malignant neoplasm of colon: Secondary | ICD-10-CM

## 2015-08-12 DIAGNOSIS — F3289 Other specified depressive episodes: Secondary | ICD-10-CM | POA: Insufficient documentation

## 2015-08-12 DIAGNOSIS — D120 Benign neoplasm of cecum: Secondary | ICD-10-CM

## 2015-08-12 DIAGNOSIS — Z6841 Body Mass Index (BMI) 40.0 and over, adult: Secondary | ICD-10-CM | POA: Insufficient documentation

## 2015-08-12 DIAGNOSIS — E785 Hyperlipidemia, unspecified: Secondary | ICD-10-CM | POA: Diagnosis not present

## 2015-08-12 DIAGNOSIS — Z87891 Personal history of nicotine dependence: Secondary | ICD-10-CM | POA: Insufficient documentation

## 2015-08-12 DIAGNOSIS — I10 Essential (primary) hypertension: Secondary | ICD-10-CM | POA: Diagnosis not present

## 2015-08-12 DIAGNOSIS — K219 Gastro-esophageal reflux disease without esophagitis: Secondary | ICD-10-CM | POA: Insufficient documentation

## 2015-08-12 DIAGNOSIS — K5730 Diverticulosis of large intestine without perforation or abscess without bleeding: Secondary | ICD-10-CM | POA: Insufficient documentation

## 2015-08-12 DIAGNOSIS — K648 Other hemorrhoids: Secondary | ICD-10-CM

## 2015-08-12 DIAGNOSIS — Z01810 Encounter for preprocedural cardiovascular examination: Secondary | ICD-10-CM | POA: Diagnosis not present

## 2015-08-12 DIAGNOSIS — Z79899 Other long term (current) drug therapy: Secondary | ICD-10-CM | POA: Insufficient documentation

## 2015-08-12 DIAGNOSIS — E119 Type 2 diabetes mellitus without complications: Secondary | ICD-10-CM | POA: Insufficient documentation

## 2015-08-12 DIAGNOSIS — Z7982 Long term (current) use of aspirin: Secondary | ICD-10-CM | POA: Insufficient documentation

## 2015-08-12 HISTORY — PX: COLONOSCOPY: SHX5424

## 2015-08-12 LAB — GLUCOSE, CAPILLARY: Glucose-Capillary: 79 mg/dL (ref 65–99)

## 2015-08-12 SURGERY — COLONOSCOPY
Anesthesia: Moderate Sedation

## 2015-08-12 MED ORDER — MIDAZOLAM HCL 5 MG/5ML IJ SOLN
INTRAMUSCULAR | Status: AC
  Filled 2015-08-12: qty 10

## 2015-08-12 MED ORDER — SODIUM CHLORIDE 0.9 % IV SOLN
INTRAVENOUS | Status: DC
  Administered 2015-08-12: 10:00:00 via INTRAVENOUS

## 2015-08-12 MED ORDER — METOPROLOL TARTRATE 1 MG/ML IV SOLN
2.5000 mg | Freq: Once | INTRAVENOUS | Status: AC
Start: 2015-08-12 — End: 2015-08-12
  Administered 2015-08-12: 2.5 mg via INTRAVENOUS

## 2015-08-12 MED ORDER — MIDAZOLAM HCL 5 MG/5ML IJ SOLN
INTRAMUSCULAR | Status: DC | PRN
  Administered 2015-08-12 (×2): 2 mg via INTRAVENOUS
  Administered 2015-08-12: 1 mg via INTRAVENOUS
  Administered 2015-08-12: 2 mg via INTRAVENOUS

## 2015-08-12 MED ORDER — STERILE WATER FOR IRRIGATION IR SOLN
Status: DC | PRN
  Administered 2015-08-12: 10:00:00

## 2015-08-12 MED ORDER — METOPROLOL TARTRATE 25 MG PO TABS: 25.0000 mg | ORAL_TABLET | Freq: Two times a day (BID) | ORAL | Status: DC

## 2015-08-12 MED ORDER — METOPROLOL TARTRATE 1 MG/ML IV SOLN: 5.0000 mg | Freq: Once | INTRAVENOUS | Status: DC

## 2015-08-12 MED ORDER — MEPERIDINE HCL 50 MG/ML IJ SOLN
INTRAMUSCULAR | Status: DC | PRN
  Administered 2015-08-12 (×2): 25 mg via INTRAVENOUS

## 2015-08-12 MED ORDER — MEPERIDINE HCL 50 MG/ML IJ SOLN
INTRAMUSCULAR | Status: AC
  Filled 2015-08-12: qty 1

## 2015-08-12 MED ORDER — METOPROLOL TARTRATE 1 MG/ML IV SOLN
INTRAVENOUS | Status: AC
  Filled 2015-08-12: qty 5

## 2015-08-12 NOTE — Op Note (Signed)
COLONOSCOPY PROCEDURE REPORT  PATIENT:  Felicia Wright  MR#:  [id number] Birthdate:  January 25, 1951, 64 y.o., female Endoscopist:  Dr. Rogene Houston, MD Referred By:  Dr. Shon Baton, MD  Procedure Date: 08/12/2015  Procedure:   Colonoscopy  Indications:  Patient is 64 year old African-American female who is undergoing average risk screening colonoscopy.  Informed Consent:  The procedure and risks were reviewed with the patient and informed consent was obtained.  Medications:  Demerol 50 mg IV Versed 7 mg IV  Description of procedure:  After a digital rectal exam was performed, that colonoscope was advanced from the anus through the rectum and colon to the area of the cecum, ileocecal valve and appendiceal orifice. The cecum was deeply intubated. These structures were well-seen and photographed for the record. From the level of the cecum and ileocecal valve, the scope was slowly and cautiously withdrawn. The mucosal surfaces were carefully surveyed utilizing scope tip to flexion to facilitate fold flattening as needed. The scope was pulled down into the rectum where a thorough exam including retroflexion was performed.  Findings:   Prep satisfactory. She had some liquid stool coating cecal mucosa but landmarks well seen after vigorous washing with water. Small polyp ablated via cold biopsy from cecum. Few scattered diverticula noted at sigmoid colon. Normal rectal mucosa. Hemorrhoids noted proximal to dentate line.   Therapeutic/Diagnostic Maneuvers Performed:  See above  Complications:  None  EBL: None  Cecal Withdrawal Time:  10  minutes  Impression:  Examination performed to cecum. Small cecal polyp ablated via cold biopsy. Mild sigmoid colon diverticulosis. Internal hemorrhoids.  Comment: Patient was documented to have atrial flatter in preop area. She converted to normal sinus rhythm spontaneously. She was given 2.5 mg of IV metoprolol and remained in normal sinus  rhythm during the procedure.  Recommendations:  Standard instructions given. I will contact patient with biopsy results and further recommendations. Will contact Dr. Ayesha Rumpf [facility] for his recommendations regarding PSVT.  REHMAN,NAJEEB U  08/12/2015 10:46 AM  CC: Dr. Maricela Curet, MD & Dr. Rayne Du ref. provider found

## 2015-08-12 NOTE — Discharge Instructions (Signed)
Resume usual medications and high fiber diet. Metoprolol 25 mg by mouth twice daily. Follow with Dr. Ayesha Felicia Wright [facility] tomorrow morning. No driving for 24 hours. Physician will call with biopsy results.  Colonoscopy, Care After These instructions give you information on caring for yourself after your procedure. Your doctor may also give you more specific instructions. Call your doctor if you have any problems or questions after your procedure. HOME CARE  Do not drive for 24 hours.  Do not sign important papers or use machinery for 24 hours.  You may shower.  You may go back to your usual activities, but go slower for the first 24 hours.  Take rest breaks often during the first 24 hours.  Walk around or use warm packs on your belly (abdomen) if you have belly cramping or gas.  Drink enough fluids to keep your pee (urine) clear or pale yellow.  Resume your normal diet. Avoid heavy or fried foods.  Avoid drinking alcohol for 24 hours or as told by your doctor.  Only take medicines as told by your doctor. If a tissue sample (biopsy) was taken during the procedure:   Do not take aspirin or blood thinners for 7 days, or as told by your doctor.  Do not drink alcohol for 7 days, or as told by your doctor.  Eat soft foods for the first 24 hours. GET HELP IF: You still have a small amount of blood in your poop (stool) 2-3 days after the procedure. GET HELP RIGHT AWAY IF:  You have more than a small amount of blood in your poop.  You see clumps of tissue (blood clots) in your poop.  Your belly is puffy (swollen).  You feel sick to your stomach (nauseous) or throw up (vomit).  You have a fever.  You have belly pain that gets worse and medicine does not help. MAKE SURE YOU:  Understand these instructions.  Will watch your condition.  Will get help right away if you are not doing well or get worse.   This information is not intended to replace advice given to you by your  health care provider. Make sure you discuss any questions you have with your health care provider.   Document Released: 11/26/2010 Document Revised: 10/29/2013 Document Reviewed: 07/01/2013 Elsevier Interactive Patient Education 2016 Elsevier Inc.   High-Fiber Diet Fiber, also called dietary fiber, is a type of carbohydrate found in fruits, vegetables, whole grains, and beans. A high-fiber diet can have many health benefits. Your health care provider may recommend a high-fiber diet to help:  Prevent constipation. Fiber can make your bowel movements more regular.  Lower your cholesterol.  Relieve hemorrhoids, uncomplicated diverticulosis, or irritable bowel syndrome.  Prevent overeating as part of a weight-loss plan.  Prevent heart disease, type 2 diabetes, and certain cancers. WHAT IS MY PLAN? The recommended daily intake of fiber includes:  38 grams for men under age 6.  73 grams for men over age 40.  77 grams for women under age 41.  90 grams for women over age 22. You can get the recommended daily intake of dietary fiber by eating a variety of fruits, vegetables, grains, and beans. Your health care provider may also recommend a fiber supplement if it is not possible to get enough fiber through your diet. WHAT DO I NEED TO KNOW ABOUT A HIGH-FIBER DIET?  Fiber supplements have not been widely studied for their effectiveness, so it is better to get fiber through food sources.  Always check the fiber content on thenutrition facts label of any prepackaged food. Look for foods that contain at least 5 grams of fiber per serving.  Ask your dietitian if you have questions about specific foods that are related to your condition, especially if those foods are not listed in the following section.  Increase your daily fiber consumption gradually. Increasing your intake of dietary fiber too quickly may cause bloating, cramping, or gas.  Drink plenty of water. Water helps you to digest  fiber. WHAT FOODS CAN I EAT? Grains Whole-grain breads. Multigrain cereal. Oats and oatmeal. Brown rice. Barley. Bulgur wheat. [place]. Bran muffins. Popcorn. Rye wafer crackers. Vegetables Sweet potatoes. Spinach. Kale. Artichokes. Cabbage. Broccoli. Green peas. Carrots. Squash. Fruits Berries. Pears. Apples. Oranges. Avocados. Prunes and raisins. Dried figs. Meats and Other Protein Sources [place], kidney, pinto, and soy beans. Split peas. Lentils. Nuts and seeds. Dairy Fiber-fortified yogurt. Beverages Fiber-fortified soy milk. Fiber-fortified orange juice. Other Fiber bars. The items listed above may not be a complete list of recommended foods or beverages. Contact your dietitian for more options. WHAT FOODS ARE NOT RECOMMENDED? Grains White bread. Pasta made with refined flour. White rice. Vegetables Fried potatoes. Canned vegetables. Well-cooked vegetables.  Fruits Fruit juice. Cooked, strained fruit. Meats and Other Protein Sources Fatty cuts of meat. Fried Sales executive or fried fish. Dairy Milk. Yogurt. Cream cheese. Sour cream. Beverages Soft drinks. Other Cakes and pastries. Butter and oils. The items listed above may not be a complete list of foods and beverages to avoid. Contact your dietitian for more information. WHAT ARE SOME TIPS FOR INCLUDING HIGH-FIBER FOODS IN MY DIET?  Eat a wide variety of high-fiber foods.  Make sure that half of all grains consumed each day are whole grains.  Replace breads and cereals made from refined flour or white flour with whole-grain breads and cereals.  Replace white rice with brown rice, bulgur wheat, or millet.  Start the day with a breakfast that is high in fiber, such as a cereal that contains at least 5 grams of fiber per serving.  Use beans in place of meat in soups, salads, or pasta.  Eat high-fiber snacks, such as berries, raw vegetables, nuts, or popcorn.   This information is not intended to replace advice given to you  by your health care provider. Make sure you discuss any questions you have with your health care provider.   Document Released: 10/24/2005 Document Revised: 11/14/2014 Document Reviewed: 04/08/2014 Elsevier Interactive Patient Education [employer].

## 2015-08-12 NOTE — H&P (Signed)
Felicia Wright is an 64 y.o. female.   Chief Complaint: Patient is here for colonoscopy HPI: Patient is 64 year old African female with multiple medical problems is in for screening colonoscopy. She denies abdominal pain change in bowel habits or rectal bleeding. First colonoscopy was about 20 years ago when she was having active bleeding determined to be due to hemorrhoids. Last colonoscopy was at  [facility] 10 years ago. Patient was noted to be tachycardic with heart rate of her own 130 to preop. EKG was obtained and it showed atrial flutter. Patient was given 2.5 mg a Metapore on IV she converted just before she received the medication. She does give history of intermittent palpitations. Patient did not experience chest pain diaphoresis or lightheadedness.      Past Medical History  Diagnosis Date  . Normocytic anemia   . Malaise and fatigue   . Oral candidiasis   . Contusion of second toe, right   . Cystitis   . Accidental fall   . Aphthous ulcer   . Hip pain, left   . Preventative health care   . Encounter for long-term (current) use of other medications   . Reactive depression (situational)   . Morbid obesity ([place])   . Sleep apnea   . Fibroid uterus   . Cataract   . Arthritis   . Incontinence   . Constipation   . IBS (irritable bowel syndrome)   . Bronchitis   . Abnormal heart rhythm   . Seizure disorder ([place])   . Hypertension   . Hyperlipidemia   . GERD (gastroesophageal reflux disease)   . Depression   . Asthma   . Allergic rhinitis   . Diabetes mellitus type II     diet controlled  . Ulnar neuropathy     Left  . Carpal tunnel syndrome   . Overactive bladder     Past Surgical History  Procedure Laterality Date  . Nasal sinus surgery  1983  . Tonsillectomy    . Hemorroidectomy    . Heel spur surgery      Right  . Carpal tunnel release      Right  . Cholecystectomy    . Knee arthroscopy      Right  . Dilation and curettage of uterus    . Breast biopsy   03/07/2012    Procedure: BREAST BIOPSY WITH NEEDLE LOCALIZATION;  Surgeon: Harl Bowie, MD;  Location: [place];  Service: General;  Laterality: Right;  needle localized right breast lumpectomy  . Total shoulder arthroplasty Right 02/28/2013    Procedure: RIGHT TOTAL SHOULDER ARTHROPLASTY;  Surgeon: Marin Shutter, MD;  Location: [place];  Service: Orthopedics;  Laterality: Right;    Family History  Problem Relation Age of Onset  . Hypertension Mother   . Heart failure Mother   . Fibroids Sister   . Hypertension Brother   . Diabetes Brother   . Arthritis Other   . Coronary artery disease Other   . Asthma Other    Social History:  reports that she quit smoking about 16 years ago. Her smoking use included Cigarettes. She has a 20 pack-year smoking history. She does not have any smokeless tobacco history on file. She reports that she does not drink alcohol or use illicit drugs.  Allergies:  Allergies  Allergen Reactions  . Sertraline Hcl     Lips swelling & loose stools  . Peach [Prunus Persica]   . Strawberry     Medications Prior  to Admission  Medication Sig Dispense Refill  . acetaminophen (TYLENOL) 500 MG tablet Take 1,000 mg by mouth at bedtime as needed for pain.     Marland Kitchen aspirin 81 MG tablet Take 162 mg by mouth at bedtime.     . celecoxib (CELEBREX) 200 MG capsule Take 200 mg by mouth every morning.     . diphenhydrAMINE (BENADRYL) 25 mg capsule Take 50 mg by mouth at bedtime as needed for allergies or sleep.     Marland Kitchen lisinopril (PRINIVIL,ZESTRIL) 20 MG tablet Take 20 mg by mouth every morning.     . magnesium gluconate (MAGONATE) 500 MG tablet Take 500 mg by mouth daily.    . Multiple Vitamin (MULTIVITAMIN WITH MINERALS) TABS Take 1 tablet by mouth daily.    Marland Kitchen omeprazole (PRILOSEC) 20 MG capsule Take 20 mg by mouth daily before breakfast.     . pravastatin (PRAVACHOL) 40 MG tablet Take 40 mg by mouth daily.    Marland Kitchen Propylene Glycol-Glycerin (SOOTHE OP) Place 1  drop into the left eye daily.    Manus Gunning BOWEL PREP SOLN Take 1 kit by mouth once. 1 Bottle 0    No results found for this or any previous visit (from the past 48 hour(s)). No results found.  ROS  Blood pressure 130/63, pulse 85, temperature 98.7 F (37.1 C), temperature source Oral, resp. rate 18, height '5\' 5"'  (1.651 m), weight 302 lb (136.986 kg), SpO2 100 %. Physical Exam  Constitutional:  Well-developed obese African-American female in NAD  HENT:  Mouth/Throat: Oropharynx is clear and moist.  Eyes: Conjunctivae are normal. No scleral icterus.  Neck: No thyromegaly present.  Cardiovascular: Normal rate, regular rhythm and normal heart sounds.   No murmur heard. Respiratory: Effort normal and breath sounds normal.  GI: Soft. She exhibits no distension and no mass. There is no tenderness.  Musculoskeletal: She exhibits no edema.  Lymphadenopathy:    She has no cervical adenopathy.  Neurological: She is alert.  Skin: Skin is warm and dry.     Assessment/Plan Average risk screening colonoscopy. Proximal SVT. EKG captured atrial flutter with patient is back in normal sinus rhythm.  REHMAN,NAJEEB U 08/12/2015, 10:00 AM

## 2015-08-12 NOTE — OR Nursing (Signed)
Patient arrived to Endoscopy for a colonoscopy. Upon placing patient on the monitor, patient's heart rate was 139-150. 12 lead EKG obtained per MD order, showing atrial flutter. EKG shown to Dr. Laural Golden who ordered metoprolol 5mg  IV. Before giving patient the metoprolol, heart rhythm converted to NSR with PVCs at a rate of 89-92. Dr. Laural Golden notified and decreased metoprolol dose to 2.5mg  IV.

## 2015-08-17 ENCOUNTER — Encounter (HOSPITAL_COMMUNITY): Payer: Self-pay | Admitting: Internal Medicine

## 2015-09-08 ENCOUNTER — Other Ambulatory Visit (INDEPENDENT_AMBULATORY_CARE_PROVIDER_SITE_OTHER): Payer: Self-pay | Admitting: Internal Medicine

## 2015-10-30 ENCOUNTER — Other Ambulatory Visit (INDEPENDENT_AMBULATORY_CARE_PROVIDER_SITE_OTHER): Payer: Self-pay | Admitting: Internal Medicine

## 2016-03-21 ENCOUNTER — Other Ambulatory Visit: Payer: Self-pay

## 2016-03-21 DIAGNOSIS — Z1231 Encounter for screening mammogram for malignant neoplasm of breast: Secondary | ICD-10-CM

## 2016-04-29 ENCOUNTER — Other Ambulatory Visit: Payer: Self-pay

## 2016-04-29 ENCOUNTER — Ambulatory Visit
Admission: RE | Admit: 2016-04-29 | Discharge: 2016-04-29 | Disposition: A | Discharge status: Home / Self Care | Payer: Medicare Other | Source: Ambulatory Visit

## 2016-04-29 DIAGNOSIS — Z1231 Encounter for screening mammogram for malignant neoplasm of breast: Secondary | ICD-10-CM

## 2017-03-14 ENCOUNTER — Other Ambulatory Visit (HOSPITAL_COMMUNITY): Payer: Self-pay | Admitting: Family Medicine

## 2017-03-14 ENCOUNTER — Ambulatory Visit (HOSPITAL_COMMUNITY)
Admission: RE | Admit: 2017-03-14 | Discharge: 2017-03-14 | Disposition: A | Discharge status: Home / Self Care | Payer: Medicare Other | Source: Ambulatory Visit | Attending: Family Medicine | Admitting: Family Medicine

## 2017-03-14 DIAGNOSIS — M150 Primary generalized (osteo)arthritis: Secondary | ICD-10-CM

## 2017-03-14 DIAGNOSIS — M25461 Effusion, right knee: Secondary | ICD-10-CM | POA: Insufficient documentation

## 2017-03-14 DIAGNOSIS — M159 Polyosteoarthritis, unspecified: Secondary | ICD-10-CM

## 2017-03-14 DIAGNOSIS — M2342 Loose body in knee, left knee: Secondary | ICD-10-CM | POA: Insufficient documentation

## 2017-03-14 DIAGNOSIS — M170 Bilateral primary osteoarthritis of knee: Secondary | ICD-10-CM | POA: Diagnosis present

## 2017-03-14 DIAGNOSIS — M8949 Other hypertrophic osteoarthropathy, multiple sites: Secondary | ICD-10-CM

## 2017-03-20 ENCOUNTER — Other Ambulatory Visit: Payer: Self-pay | Admitting: Family Medicine

## 2017-03-20 DIAGNOSIS — Z1231 Encounter for screening mammogram for malignant neoplasm of breast: Secondary | ICD-10-CM

## 2017-05-01 ENCOUNTER — Ambulatory Visit
Admission: RE | Admit: 2017-05-01 | Discharge: 2017-05-01 | Disposition: A | Discharge status: Home / Self Care | Payer: Medicare Other | Source: Ambulatory Visit | Attending: Family Medicine | Admitting: Family Medicine

## 2017-05-01 DIAGNOSIS — Z1231 Encounter for screening mammogram for malignant neoplasm of breast: Secondary | ICD-10-CM

## 2018-02-03 LAB — GLUCOSE, POCT (MANUAL RESULT ENTRY): POC Glucose: 102 mg/dl — AB (ref 70–99)

## 2018-03-26 ENCOUNTER — Other Ambulatory Visit: Payer: Self-pay | Admitting: Family Medicine

## 2018-03-26 DIAGNOSIS — Z1231 Encounter for screening mammogram for malignant neoplasm of breast: Secondary | ICD-10-CM

## 2018-05-04 ENCOUNTER — Ambulatory Visit
Admission: RE | Admit: 2018-05-04 | Discharge: 2018-05-04 | Disposition: A | Discharge status: Home / Self Care | Payer: Medicare Other | Source: Ambulatory Visit | Attending: Family Medicine | Admitting: Family Medicine

## 2018-05-04 ENCOUNTER — Ambulatory Visit: Payer: Medicare Other

## 2018-05-04 DIAGNOSIS — Z1231 Encounter for screening mammogram for malignant neoplasm of breast: Secondary | ICD-10-CM

## 2019-05-06 ENCOUNTER — Other Ambulatory Visit: Payer: Self-pay | Admitting: Family Medicine

## 2019-05-06 DIAGNOSIS — Z1231 Encounter for screening mammogram for malignant neoplasm of breast: Secondary | ICD-10-CM

## 2019-06-19 ENCOUNTER — Other Ambulatory Visit: Payer: Self-pay

## 2019-06-19 ENCOUNTER — Ambulatory Visit
Admission: RE | Admit: 2019-06-19 | Discharge: 2019-06-19 | Disposition: A | Discharge status: Home / Self Care | Payer: Medicare Other | Source: Ambulatory Visit | Attending: Family Medicine | Admitting: Family Medicine

## 2019-06-19 DIAGNOSIS — Z1231 Encounter for screening mammogram for malignant neoplasm of breast: Secondary | ICD-10-CM

## 2019-11-28 ENCOUNTER — Other Ambulatory Visit: Payer: Self-pay

## 2019-11-28 DIAGNOSIS — I83891 Varicose veins of right lower extremities with other complications: Secondary | ICD-10-CM

## 2019-11-29 ENCOUNTER — Ambulatory Visit (HOSPITAL_COMMUNITY)
Admission: RE | Admit: 2019-11-29 | Discharge: 2019-11-29 | Disposition: A | Discharge status: Home / Self Care | Payer: Medicare Other | Source: Ambulatory Visit | Attending: Surgery | Admitting: Surgery

## 2019-11-29 ENCOUNTER — Encounter: Payer: Self-pay | Admitting: Vascular Surgery

## 2019-11-29 ENCOUNTER — Other Ambulatory Visit: Payer: Self-pay

## 2019-11-29 ENCOUNTER — Ambulatory Visit: Payer: Medicare Other | Admitting: Vascular Surgery

## 2019-11-29 VITALS — BP 128/78 | HR 68 | Temp 97.7°F | Resp 20 | Ht 65.0 in | Wt 177.8 lb

## 2019-11-29 DIAGNOSIS — I83891 Varicose veins of right lower extremities with other complications: Secondary | ICD-10-CM | POA: Diagnosis not present

## 2019-11-29 NOTE — Progress Notes (Signed)
Patient ID: Felicia Wright, female   DOB: [birth year], 69 y.o.   MRN: [id number]  Reason for Consult: New Patient (Initial Visit)   Referred by Lucia Gaskins, MD  Subjective:     HPI:  Felicia Wright is a 69 y.o. female with history of significant weight loss previously weighing over 400 pounds.  She has done this with becoming a vegan and riding a recumbent bike for an hour daily.  She does have significant lower extremity extensive tissue also has varicose veins.  Previously had concern for DVT in her left lower extremity several years ago although this was a negative study.  She has no lower extremity tissue loss or ulceration.  She is now scheduled to have right knee replacement.  She does have 2 areas that she is concerned about on the right lateral aspect of her leg that feels like small knots to her.  She has not had any bleeding from these issues.  Past Medical History:  Diagnosis Date  . Abnormal heart rhythm   . Accidental fall   . Allergic rhinitis   . Aphthous ulcer   . Arthritis   . Asthma   . Bronchitis   . Carpal tunnel syndrome   . Cataract   . Constipation   . Contusion of second toe, right   . Cystitis   . Depression   . Diabetes mellitus type II    diet controlled  . Encounter for long-term (current) use of other medications   . Fibroid uterus   . GERD (gastroesophageal reflux disease)   . Hip pain, left   . Hyperlipidemia   . Hypertension   . IBS (irritable bowel syndrome)   . Incontinence   . Malaise and fatigue   . Morbid obesity ([place])   . Normocytic anemia   . Oral candidiasis   . Overactive bladder   . Preventative health care   . Reactive depression (situational)   . Seizure disorder ([place])   . Sleep apnea   . Ulnar neuropathy    Left   Family History  Problem Relation Age of Onset  . Hypertension Mother   . Heart failure Mother   . Fibroids Sister   . Hypertension Brother   . Diabetes Brother   . Arthritis Other   . Coronary artery  disease Other   . Asthma Other    Past Surgical History:  Procedure Laterality Date  . BREAST BIOPSY  03/07/2012   Procedure: BREAST BIOPSY WITH NEEDLE LOCALIZATION;  Surgeon: Harl Bowie, MD;  Location: [place];  Service: General;  Laterality: Right;  needle localized right breast lumpectomy  . BREAST EXCISIONAL BIOPSY Right    right  . CARPAL TUNNEL RELEASE     Right  . CHOLECYSTECTOMY    . COLONOSCOPY N/A 08/12/2015   Procedure: COLONOSCOPY;  Surgeon: Rogene Houston, MD;  [facility];  Service: Endoscopy;  Laterality: N/A;  955  . DILATION AND CURETTAGE OF UTERUS    . HEEL SPUR SURGERY     Right  . HEMORROIDECTOMY    . KNEE ARTHROSCOPY     Right  . NASAL SINUS SURGERY  1983  . TONSILLECTOMY    . TOTAL SHOULDER ARTHROPLASTY Right 02/28/2013   Procedure: RIGHT TOTAL SHOULDER ARTHROPLASTY;  Surgeon: Marin Shutter, MD;  Location: [place];  Service: Orthopedics;  Laterality: Right;    Short Social History:  Social History   Tobacco Use  . Smoking status: Former Smoker  Packs/day: 2.00    Years: 10.00    Pack years: 20.00    Types: Cigarettes    Quit date: 03/03/1999    Years since quitting: 20.7  . Smokeless tobacco: Never Used  Substance Use Topics  . Alcohol use: No    Allergies  Allergen Reactions  . Sertraline Hcl     Lips swelling & loose stools  . Bee Venom   . Peach [Prunus Persica]   . Strawberry Extract     Current Outpatient Medications  Medication Sig Dispense Refill  . acetaminophen (TYLENOL) 500 MG tablet Take 1,000 mg by mouth at bedtime as needed for pain.     Marland Kitchen aspirin 81 MG tablet Take 162 mg by mouth at bedtime.     Marland Kitchen BLACK CURRANT SEED OIL PO Take by mouth.    . celecoxib (CELEBREX) 200 MG capsule Take 200 mg by mouth every morning.     Marland Kitchen co-enzyme Q-10 30 MG capsule Take 30 mg by mouth 3 (three) times daily.    . diphenhydrAMINE (BENADRYL) 25 mg capsule Take 50 mg by mouth at bedtime as needed for allergies  or sleep.     Marland Kitchen LINZESS 72 MCG capsule Take 72 mcg by mouth daily.    Marland Kitchen lisinopril (PRINIVIL,ZESTRIL) 20 MG tablet Take 20 mg by mouth every morning.     . magnesium gluconate (MAGONATE) 500 MG tablet Take 500 mg by mouth daily.    . metoprolol tartrate (LOPRESSOR) 25 MG tablet TAKE 1 TABLET BY MOUTH TWICE DAILY. 60 tablet 0  . Multiple Vitamin (MULTIVITAMIN WITH MINERALS) TABS Take 1 tablet by mouth daily.    Marland Kitchen omeprazole (PRILOSEC) 20 MG capsule Take 20 mg by mouth daily before breakfast.     . Propylene Glycol-Glycerin (SOOTHE OP) Place 1 drop into the left eye daily.    . simvastatin (ZOCOR) 40 MG tablet Take 40 mg by mouth at bedtime.    . vitamin B-12 (CYANOCOBALAMIN) 500 MCG tablet Take 500 mcg by mouth daily.     No current facility-administered medications for this visit.    Review of Systems  Constitutional:  Constitutional negative. HENT: HENT negative.  Eyes: Eyes negative.  Respiratory: Respiratory negative.  Cardiovascular: Positive for leg swelling.  GI: Gastrointestinal negative.  Skin: Skin negative.  Neurological: Neurological negative. Hematologic: Hematologic/lymphatic negative.  Psychiatric: Psychiatric negative.        Objective:  Objective   Vitals:   11/29/19 1052  BP: 128/78  Pulse: 68  Resp: 20  Temp: 97.7 F (36.5 C)  SpO2: 96%  Weight: 177 lb 12.8 oz (80.6 kg)  Height: 5\' 5"  (1.651 m)   Body mass index is 29.59 kg/m.  Physical Exam Constitutional:      Appearance: Normal appearance.  HENT:     Nose:     Comments: Mask in place Eyes:     Pupils: Pupils are equal, round, and reactive to light.  Cardiovascular:     Rate and Rhythm: Normal rate.     Pulses: Normal pulses.  Pulmonary:     Effort: Pulmonary effort is normal.     Breath sounds: Normal breath sounds.  Abdominal:     General: Abdomen is flat.     Palpations: Abdomen is soft.  Musculoskeletal:        General: No swelling. Normal range of motion.  Skin:    General:  Skin is warm and dry.     Capillary Refill: Capillary refill takes less than 2 seconds.  Neurological:     General: No focal deficit present.     Mental Status: She is alert.  Psychiatric:        Mood and Affect: Mood normal.        Behavior: Behavior normal.        Thought Content: Thought content normal.        Judgment: Judgment normal.     Data: I have independently interpreted her lower extremity venous reflux study which demonstrates no reflux on the left side.  On the right side she has reflux of saphenofemoral junction which measures 0.65 cm all the way through to the knee which measures 0.69 cm.  Small saphenous vein also has reflux greatest diameter 0.43 cm.     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  She does have some varicosities on the right with venous reflux but most of her issues appear to be related to her knee pain.  These issues should not be prohibitive for knee replacement.  Patient has lost significant weight which is actually quite remarkable.  She can see me on an as-needed basis.     Waynetta Sandy MD [facility]

## 2020-01-18 ENCOUNTER — Ambulatory Visit
Admission: EM | Admit: 2020-01-18 | Discharge: 2020-01-18 | Disposition: A | Discharge status: Home / Self Care | Payer: Medicare Other | Attending: Emergency Medicine | Admitting: Emergency Medicine

## 2020-01-18 ENCOUNTER — Other Ambulatory Visit: Payer: Self-pay

## 2020-01-18 DIAGNOSIS — L03012 Cellulitis of left finger: Secondary | ICD-10-CM

## 2020-01-18 MED ORDER — MUPIROCIN CALCIUM 2 % EX CREA: 1.0000 "application " | TOPICAL_CREAM | Freq: Two times a day (BID) | CUTANEOUS | 0 refills | Status: DC

## 2020-01-18 MED ORDER — CEPHALEXIN 250 MG PO CAPS: 250.0000 mg | ORAL_CAPSULE | Freq: Four times a day (QID) | ORAL | 0 refills | Status: DC

## 2020-01-18 NOTE — Discharge Instructions (Signed)
Apply  Bactroban cream as prescribed Wash site daily with warm water and mild soap Keep covered to avoid friction Take antibiotic as prescribed and to completion Follow up here or with PCP if symptoms persists Return or go to the ED if you have any new or worsening symptoms

## 2020-01-18 NOTE — ED Provider Notes (Signed)
[facility]    CSN: [id number] Arrival date & time: 01/18/20  0816      History   Chief Complaint Chief Complaint  Patient presents with  . finger infection    HPI Felicia Wright is a 69 y.o. female.   Who presented to the urgent care with a complaint of left index finger swelling and drainage around her nail for the past 4 days. Denies a precipitating event, or specific injury.  States symptom worsening the past 2 days with white drainage.    Has tried OTC medications without relief.  Nothing made her symptoms worse.  Denies similar symptoms in the past.  Denies fever, chills, nausea, vomiting, diarrhea, chest pain, chest tightness.  The history is provided by the patient. No language interpreter was used.    Past Medical History:  Diagnosis Date  . Abnormal heart rhythm   . Accidental fall   . Allergic rhinitis   . Aphthous ulcer   . Arthritis   . Asthma   . Bronchitis   . Carpal tunnel syndrome   . Cataract   . Constipation   . Contusion of second toe, right   . Cystitis   . Depression   . Diabetes mellitus type II    diet controlled  . Encounter for long-term (current) use of other medications   . Fibroid uterus   . GERD (gastroesophageal reflux disease)   . Hip pain, left   . Hyperlipidemia   . Hypertension   . IBS (irritable bowel syndrome)   . Incontinence   . Malaise and fatigue   . Morbid obesity ([place])   . Normocytic anemia   . Oral candidiasis   . Overactive bladder   . Preventative health care   . Reactive depression (situational)   . Seizure disorder ([place])   . Sleep apnea   . Ulnar neuropathy    Left    Patient Active Problem List   Diagnosis Date Noted  . Pain in joint, shoulder region 04/02/2013  . Muscle weakness (generalized) 04/02/2013  . Shoulder arthritis 02/28/2013  . Breast calcifications on mammogram 02/16/2012  . KNEE, ARTHRITIS, DEGEN./OSTEO 05/28/2009  . MORBID OBESITY 01/09/2007  . DIABETES MELLITUS, TYPE II,  CONTROLLED 12/27/2006  . FIBROIDS, UTERUS 10/10/2006  . HYPERLIPIDEMIA 10/10/2006  . CARPAL TUNNEL SYNDROME 10/10/2006  . CATARACT NOS 10/10/2006  . HYPERTENSION 10/10/2006  . ALLERGIC RHINITIS 10/10/2006  . ASTHMA 10/10/2006  . GERD 10/10/2006  . IBS 10/10/2006  . OVERACTIVE BLADDER 10/10/2006  . ARTHRITIS 10/10/2006    Past Surgical History:  Procedure Laterality Date  . BREAST BIOPSY  03/07/2012   Procedure: BREAST BIOPSY WITH NEEDLE LOCALIZATION;  Surgeon: Harl Bowie, MD;  [facility];  Service: General;  Laterality: Right;  needle localized right breast lumpectomy  . BREAST EXCISIONAL BIOPSY Right    right  . CARPAL TUNNEL RELEASE     Right  . CHOLECYSTECTOMY    . COLONOSCOPY N/A 08/12/2015   Procedure: COLONOSCOPY;  Surgeon: Rogene Houston, MD;  [facility];  Service: Endoscopy;  Laterality: N/A;  955  . DILATION AND CURETTAGE OF UTERUS    . HEEL SPUR SURGERY     Right  . HEMORROIDECTOMY    . KNEE ARTHROSCOPY     Right  . NASAL SINUS SURGERY  1983  . TONSILLECTOMY    . TOTAL SHOULDER ARTHROPLASTY Right 02/28/2013   Procedure: RIGHT TOTAL SHOULDER ARTHROPLASTY;  Surgeon: Marin Shutter, MD;  Location: [place];  Service: Orthopedics;  Laterality: Right;    OB History   No obstetric history on file.      Home Medications    Prior to Admission medications   Medication Sig Start Date End Date Taking? Authorizing Provider  acetaminophen (TYLENOL) 500 MG tablet Take 1,000 mg by mouth at bedtime as needed for pain.     [provider]  aspirin 81 MG tablet Take 162 mg by mouth at bedtime.     [provider]  BLACK CURRANT SEED OIL PO Take by mouth.    [provider]  celecoxib (CELEBREX) 200 MG capsule Take 200 mg by mouth every morning.     [provider]  cephALEXin (KEFLEX) 250 MG capsule Take 1 capsule (250 mg total) by mouth 4 (four) times daily. 01/18/20   Walter Grima, Darrelyn Hillock, FNP    co-enzyme Q-10 30 MG capsule Take 30 mg by mouth 3 (three) times daily.    [provider]  diphenhydrAMINE (BENADRYL) 25 mg capsule Take 50 mg by mouth at bedtime as needed for allergies or sleep.     [provider]  LINZESS 72 MCG capsule Take 72 mcg by mouth daily. 09/18/19   [provider]  lisinopril (PRINIVIL,ZESTRIL) 20 MG tablet Take 20 mg by mouth every morning.     [provider]  magnesium gluconate (MAGONATE) 500 MG tablet Take 500 mg by mouth daily.    [provider]  metoprolol tartrate (LOPRESSOR) 25 MG tablet TAKE 1 TABLET BY MOUTH TWICE DAILY. 09/10/15   Setzer, Rona Ravens, NP  Multiple Vitamin (MULTIVITAMIN WITH MINERALS) TABS Take 1 tablet by mouth daily.    [provider]  mupirocin cream (BACTROBAN) 2 % Apply 1 application topically 2 (two) times daily. 01/18/20   Kelena Garrow, Darrelyn Hillock, FNP  omeprazole (PRILOSEC) 20 MG capsule Take 20 mg by mouth daily before breakfast.     [provider]  Propylene Glycol-Glycerin (SOOTHE OP) Place 1 drop into the left eye daily.    [provider]  simvastatin (ZOCOR) 40 MG tablet Take 40 mg by mouth at bedtime. 09/18/19   [provider]  vitamin B-12 (CYANOCOBALAMIN) 500 MCG tablet Take 500 mcg by mouth daily.    [provider]    Family History Family History  Problem Relation Age of Onset  . Hypertension Mother   . Heart failure Mother   . Fibroids Sister   . Hypertension Brother   . Diabetes Brother   . Arthritis Other   . Coronary artery disease Other   . Asthma Other     Social History Social History   Tobacco Use  . Smoking status: Former Smoker    Packs/day: 2.00    Years: 10.00    Pack years: 20.00    Types: Cigarettes    Quit date: 03/03/1999    Years since quitting: 20.8  . Smokeless tobacco: Never Used  Substance Use Topics  . Alcohol use: No  . Drug use: No     Allergies   Sertraline hcl, Bee venom, Peach [prunus  persica], and Strawberry extract   Review of Systems Review of Systems  Constitutional: Negative.   Respiratory: Negative.   Cardiovascular: Negative.   Skin: Positive for color change.  All other systems reviewed and are negative.    Physical Exam Triage Vital Signs ED Triage Vitals  Enc Vitals Group     BP 01/18/20 0838 123/71     Pulse  Rate 01/18/20 0838 (!) 57     Resp 01/18/20 0838 15     Temp 01/18/20 0838 98.4 F (36.9 C)     Temp Source 01/18/20 0838 Oral     SpO2 01/18/20 0838 98 %     Weight --      Height --      Head Circumference --      Peak Flow --      Pain Score 01/18/20 0850 5     Pain Loc --      Pain Edu? --      Excl. in [place]? --    No data found.  Updated Vital Signs BP 123/71 (BP Location: Right Arm)   Pulse (!) 57   Temp 98.4 F (36.9 C) (Oral)   Resp 15   SpO2 98%   Visual Acuity Right Eye Distance:   Left Eye Distance:   Bilateral Distance:    Right Eye Near:   Left Eye Near:    Bilateral Near:     Physical Exam Vitals and nursing note reviewed.  Constitutional:      General: She is not in acute distress.    Appearance: Normal appearance. She is normal weight. She is not ill-appearing or toxic-appearing.  Cardiovascular:     Rate and Rhythm: Normal rate.     Pulses: Normal pulses.     Heart sounds: Normal heart sounds. No murmur. No gallop.   Pulmonary:     Effort: Pulmonary effort is normal. No respiratory distress.     Breath sounds: Normal breath sounds. No stridor. No wheezing, rhonchi or rales.  Chest:     Chest wall: No tenderness.  Skin:    General: Skin is warm.     Capillary Refill: Capillary refill takes less than 2 seconds.     Coloration: Skin is not jaundiced or pale.     Findings: Erythema present. No abscess, lesion or rash.     Comments: Erythema and swelling around her left index finger nail  Neurological:     Mental Status: She is alert and oriented to person, place, and time.      UC Treatments /  Results  Labs (all labs ordered are listed, but only abnormal results are displayed) Labs Reviewed - No data to display  EKG   Radiology No results found.  Procedures Procedures (including critical care time)  Medications Ordered in UC Medications - No data to display  Initial Impression / Assessment and Plan / UC Course  I have reviewed the triage vital signs and the nursing notes.  Pertinent labs & imaging results that were available during my care of the patient were reviewed by me and considered in my medical decision making (see chart for details).    Patient is stable at discharge. Mupirocin cream will be prescribed Keflex will be prescribed Advised patient to follow-up with primary care Return for worsening symptoms  Final Clinical Impressions(s) / UC Diagnoses   Final diagnoses:  Paronychia of left index finger     Discharge Instructions     Apply  Bactroban cream as prescribed Wash site daily with warm water and mild soap Keep covered to avoid friction Take antibiotic as prescribed and to completion Follow up here or with PCP if symptoms persists Return or go to the ED if you have any new or worsening symptoms     ED Prescriptions    Medication Sig Dispense Auth. Provider   mupirocin cream (BACTROBAN) 2 % Apply 1  application topically 2 (two) times daily. 15 g Caitrin Pendergraph, Darrelyn Hillock, FNP   cephALEXin (KEFLEX) 250 MG capsule Take 1 capsule (250 mg total) by mouth 4 (four) times daily. 28 capsule Kalkidan Caudell, Darrelyn Hillock, FNP     PDMP not reviewed this encounter.   Emerson Monte, [place] 01/18/20 [phone]

## 2020-01-18 NOTE — ED Triage Notes (Signed)
Patient states that she pulled a hang nail on the right index finger x 4 days ago, worsened over the last day. very swollen and draining pus

## 2020-05-14 ENCOUNTER — Other Ambulatory Visit: Payer: Self-pay | Admitting: Family Medicine

## 2020-05-14 DIAGNOSIS — Z1231 Encounter for screening mammogram for malignant neoplasm of breast: Secondary | ICD-10-CM

## 2020-06-19 ENCOUNTER — Other Ambulatory Visit: Payer: Self-pay

## 2020-06-19 ENCOUNTER — Ambulatory Visit
Admission: RE | Admit: 2020-06-19 | Discharge: 2020-06-19 | Disposition: A | Discharge status: Home / Self Care | Payer: Medicare Other | Source: Ambulatory Visit | Attending: Family Medicine | Admitting: Family Medicine

## 2020-06-19 DIAGNOSIS — Z1231 Encounter for screening mammogram for malignant neoplasm of breast: Secondary | ICD-10-CM

## 2020-12-22 DIAGNOSIS — E7849 Other hyperlipidemia: Secondary | ICD-10-CM | POA: Diagnosis not present

## 2020-12-22 DIAGNOSIS — G8929 Other chronic pain: Secondary | ICD-10-CM | POA: Diagnosis not present

## 2020-12-22 DIAGNOSIS — E1165 Type 2 diabetes mellitus with hyperglycemia: Secondary | ICD-10-CM | POA: Diagnosis not present

## 2020-12-22 DIAGNOSIS — K219 Gastro-esophageal reflux disease without esophagitis: Secondary | ICD-10-CM | POA: Diagnosis not present

## 2021-01-19 DIAGNOSIS — G4733 Obstructive sleep apnea (adult) (pediatric): Secondary | ICD-10-CM | POA: Diagnosis not present

## 2021-02-16 DIAGNOSIS — K219 Gastro-esophageal reflux disease without esophagitis: Secondary | ICD-10-CM | POA: Diagnosis not present

## 2021-02-16 DIAGNOSIS — I110 Hypertensive heart disease with heart failure: Secondary | ICD-10-CM | POA: Diagnosis not present

## 2021-02-16 DIAGNOSIS — E7849 Other hyperlipidemia: Secondary | ICD-10-CM | POA: Diagnosis not present

## 2021-02-16 DIAGNOSIS — E1165 Type 2 diabetes mellitus with hyperglycemia: Secondary | ICD-10-CM | POA: Diagnosis not present

## 2021-04-13 DIAGNOSIS — E559 Vitamin D deficiency, unspecified: Secondary | ICD-10-CM | POA: Diagnosis not present

## 2021-04-13 DIAGNOSIS — E7849 Other hyperlipidemia: Secondary | ICD-10-CM | POA: Diagnosis not present

## 2021-06-07 ENCOUNTER — Other Ambulatory Visit: Payer: Self-pay | Admitting: Family Medicine

## 2021-06-07 DIAGNOSIS — Z1231 Encounter for screening mammogram for malignant neoplasm of breast: Secondary | ICD-10-CM

## 2021-07-06 ENCOUNTER — Encounter (HOSPITAL_COMMUNITY): Payer: Self-pay | Admitting: *Deleted

## 2021-07-06 ENCOUNTER — Emergency Department (HOSPITAL_COMMUNITY)
Admission: EM | Admit: 2021-07-06 | Discharge: 2021-07-06 | Disposition: A | Discharge status: Home / Self Care | Payer: Medicare Other | Attending: Emergency Medicine | Admitting: Emergency Medicine

## 2021-07-06 ENCOUNTER — Other Ambulatory Visit: Payer: Self-pay

## 2021-07-06 DIAGNOSIS — R5383 Other fatigue: Secondary | ICD-10-CM | POA: Diagnosis not present

## 2021-07-06 DIAGNOSIS — Z5321 Procedure and treatment not carried out due to patient leaving prior to being seen by health care provider: Secondary | ICD-10-CM | POA: Insufficient documentation

## 2021-07-06 NOTE — ED Triage Notes (Signed)
Patient had home evaluation today and pulse was low. Advised patient to come to the hospital because of low pulse rate. Patient states she is currently without a PCP.

## 2021-07-06 NOTE — ED Notes (Signed)
Patient left after triage, inform registration staff

## 2021-07-07 ENCOUNTER — Other Ambulatory Visit: Payer: Self-pay

## 2021-07-07 ENCOUNTER — Ambulatory Visit
Admission: EM | Admit: 2021-07-07 | Discharge: 2021-07-07 | Disposition: A | Discharge status: Home / Self Care | Payer: Medicare Other | Attending: Family Medicine | Admitting: Family Medicine

## 2021-07-07 DIAGNOSIS — I499 Cardiac arrhythmia, unspecified: Secondary | ICD-10-CM

## 2021-07-07 DIAGNOSIS — I10 Essential (primary) hypertension: Secondary | ICD-10-CM | POA: Diagnosis not present

## 2021-07-07 NOTE — Discharge Instructions (Addendum)
Begin taking 1/2 tablet of metoprolol daily as your heart rate may be getting too low and making you feel tired and slow.

## 2021-07-07 NOTE — ED Provider Notes (Signed)
[facility]   [id number] 07/07/21 Arrival Time: [id number]  ASSESSMENT & PLAN:  1. Elevated blood pressure reading in office with diagnosis of hypertension   2. Cardiac arrhythmia, unspecified cardiac arrhythmia type    ECG reviewed by me: NSR with occasional PVC. No STEMI. VSS. Elevated BP without symptoms.     Discharge Instructions      Begin taking 1/2 tablet of metoprolol daily as your heart rate may be getting too low and making you feel tired and slow.      Follow-up Information     Schedule an appointment as soon as possible for a visit  with Lindell Spar, MD.   Specialty: Internal Medicine Contact information: [address] [place] [address] [phone]                 Reviewed expectations re: course of current medical issues. Questions answered. Outlined signs and symptoms indicating need for more acute intervention. Patient verbalized understanding. After Visit Summary given.   SUBJECTIVE:  Felicia Wright is a 70 y.o. female who reports her home health told her that her pulse was very low; around 40. Does feel lightheaded occasionally. Is fatigued. No CP or resp issues. Otherwise feeling well.  Social History   Tobacco Use  Smoking Status Former   Packs/day: 2.00   Years: 10.00   Pack years: 20.00   Types: Cigarettes   Quit date: 03/03/1999   Years since quitting: 22.3  Smokeless Tobacco Never    OBJECTIVE:  Vitals:   07/07/21 0923  BP: (!) 170/81  Pulse: 76  Resp: 18  Temp: 98.7 F (37.1 C)  SpO2: 96%    General appearance: alert; no distress Neck: supple Lungs: clear to auscultation bilaterally Heart: irregular Skin: warm and dry Psychological: alert and cooperative; normal mood and affect   Allergies  Allergen Reactions   Sertraline Hcl     Lips swelling & loose stools   Bee Venom    Peach [Prunus Persica]    Strawberry Extract     Past Medical History:  Diagnosis Date   Abnormal heart rhythm     Accidental fall    Allergic rhinitis    Aphthous ulcer    Arthritis    Asthma    Bronchitis    Carpal tunnel syndrome    Cataract    Constipation    Contusion of second toe, right    Cystitis    Depression    Diabetes mellitus type II    diet controlled   Encounter for long-term (current) use of other medications    Fibroid uterus    GERD (gastroesophageal reflux disease)    Hip pain, left    Hyperlipidemia    Hypertension    IBS (irritable bowel syndrome)    Incontinence    Malaise and fatigue    Morbid obesity ([facility])    Normocytic anemia    Oral candidiasis    Overactive bladder    Preventative health care    Reactive depression (situational)    Seizure disorder ([facility])    Sleep apnea    Ulnar neuropathy    Left   Social History   Socioeconomic History   Marital status: Divorced    Spouse name: Not on file   Number of children: Not on file   Years of education: Not on file   Highest education level: Not on file  Occupational History   Occupation: Nurse aide    Employer: UNEMPLOYED    Comment:  Now disabled  Tobacco Use   Smoking status: Former    Packs/day: 2.00    Years: 10.00    Pack years: 20.00    Types: Cigarettes    Quit date: 03/03/1999    Years since quitting: 22.3   Smokeless tobacco: Never  Vaping Use   Vaping Use: Never used  Substance and Sexual Activity   Alcohol use: No   Drug use: No   Sexual activity: Not on file  Other Topics Concern   Not on file  Social History Narrative   Divorced   Finished 1 year of college   Lives alone   Social Determinants of Health   Financial Resource Strain: Not on file  Food Insecurity: Not on file  Transportation Needs: Not on file  Physical Activity: Not on file  Stress: Not on file  Social Connections: Not on file  Intimate Partner Violence: Not on file   Family History  Problem Relation Age of Onset   Hypertension Mother    Heart failure Mother    Fibroids Sister    Hypertension  Brother    Diabetes Brother    Arthritis Other    Coronary artery disease Other    Asthma Other    Past Surgical History:  Procedure Laterality Date   BREAST BIOPSY  03/07/2012   Procedure: BREAST BIOPSY WITH NEEDLE LOCALIZATION;  Surgeon: Harl Bowie, MD;  Location: [place];  Service: General;  Laterality: Right;  needle localized right breast lumpectomy   BREAST EXCISIONAL BIOPSY Right    right   CARPAL TUNNEL RELEASE     Right   CHOLECYSTECTOMY     COLONOSCOPY N/A 08/12/2015   Procedure: COLONOSCOPY;  Surgeon: Rogene Houston, MD;  [facility];  Service: Endoscopy;  Laterality: N/A;  [place] SPUR SURGERY     Right   HEMORROIDECTOMY     KNEE ARTHROSCOPY     Right   NASAL SINUS SURGERY  1983   TONSILLECTOMY     TOTAL SHOULDER ARTHROPLASTY Right 02/28/2013   Procedure: RIGHT TOTAL SHOULDER ARTHROPLASTY;  Surgeon: Marin Shutter, MD;  Location: [facility];  Service: Orthopedics;  Laterality: Right;       Vanessa Kick, MD 07/07/21 1050

## 2021-07-07 NOTE — ED Triage Notes (Signed)
Pt states home care nurse suggested she be evaluated by MD for low pulse and high blood pressure , pts PCP closed practice

## 2021-07-14 DIAGNOSIS — I10 Essential (primary) hypertension: Secondary | ICD-10-CM | POA: Diagnosis not present

## 2021-07-19 DIAGNOSIS — G4489 Other headache syndrome: Secondary | ICD-10-CM | POA: Diagnosis not present

## 2021-07-19 DIAGNOSIS — R52 Pain, unspecified: Secondary | ICD-10-CM | POA: Diagnosis not present

## 2021-07-19 DIAGNOSIS — I10 Essential (primary) hypertension: Secondary | ICD-10-CM | POA: Diagnosis not present

## 2021-07-22 DIAGNOSIS — I10 Essential (primary) hypertension: Secondary | ICD-10-CM | POA: Diagnosis not present

## 2021-07-22 DIAGNOSIS — R079 Chest pain, unspecified: Secondary | ICD-10-CM | POA: Diagnosis not present

## 2021-07-28 ENCOUNTER — Other Ambulatory Visit: Payer: Self-pay

## 2021-07-28 ENCOUNTER — Ambulatory Visit
Admission: RE | Admit: 2021-07-28 | Discharge: 2021-07-28 | Disposition: A | Discharge status: Home / Self Care | Payer: Medicare Other | Source: Ambulatory Visit | Attending: Family Medicine | Admitting: Family Medicine

## 2021-07-28 DIAGNOSIS — Z1231 Encounter for screening mammogram for malignant neoplasm of breast: Secondary | ICD-10-CM

## 2021-08-10 DIAGNOSIS — Z0189 Encounter for other specified special examinations: Secondary | ICD-10-CM | POA: Diagnosis not present

## 2021-08-10 DIAGNOSIS — E785 Hyperlipidemia, unspecified: Secondary | ICD-10-CM | POA: Diagnosis not present

## 2021-08-10 DIAGNOSIS — R011 Cardiac murmur, unspecified: Secondary | ICD-10-CM | POA: Diagnosis not present

## 2021-08-10 DIAGNOSIS — E559 Vitamin D deficiency, unspecified: Secondary | ICD-10-CM | POA: Diagnosis not present

## 2021-08-10 DIAGNOSIS — K219 Gastro-esophageal reflux disease without esophagitis: Secondary | ICD-10-CM | POA: Diagnosis not present

## 2021-08-10 DIAGNOSIS — G4733 Obstructive sleep apnea (adult) (pediatric): Secondary | ICD-10-CM | POA: Diagnosis not present

## 2021-08-10 DIAGNOSIS — M1990 Unspecified osteoarthritis, unspecified site: Secondary | ICD-10-CM | POA: Diagnosis not present

## 2021-08-10 DIAGNOSIS — I10 Essential (primary) hypertension: Secondary | ICD-10-CM | POA: Diagnosis not present

## 2021-08-10 DIAGNOSIS — K5900 Constipation, unspecified: Secondary | ICD-10-CM | POA: Diagnosis not present

## 2021-08-10 DIAGNOSIS — I4891 Unspecified atrial fibrillation: Secondary | ICD-10-CM | POA: Diagnosis not present

## 2021-08-11 DIAGNOSIS — G4733 Obstructive sleep apnea (adult) (pediatric): Secondary | ICD-10-CM | POA: Diagnosis not present

## 2021-08-25 DIAGNOSIS — I10 Essential (primary) hypertension: Secondary | ICD-10-CM | POA: Diagnosis not present

## 2021-08-25 DIAGNOSIS — E559 Vitamin D deficiency, unspecified: Secondary | ICD-10-CM | POA: Diagnosis not present

## 2021-08-25 DIAGNOSIS — Z131 Encounter for screening for diabetes mellitus: Secondary | ICD-10-CM | POA: Diagnosis not present

## 2021-09-01 ENCOUNTER — Other Ambulatory Visit (HOSPITAL_COMMUNITY): Payer: Self-pay | Admitting: Family Medicine

## 2021-09-01 DIAGNOSIS — Z13820 Encounter for screening for osteoporosis: Secondary | ICD-10-CM | POA: Diagnosis not present

## 2021-09-01 DIAGNOSIS — D696 Thrombocytopenia, unspecified: Secondary | ICD-10-CM | POA: Diagnosis not present

## 2021-09-01 DIAGNOSIS — R011 Cardiac murmur, unspecified: Secondary | ICD-10-CM | POA: Diagnosis not present

## 2021-09-01 DIAGNOSIS — E559 Vitamin D deficiency, unspecified: Secondary | ICD-10-CM | POA: Diagnosis not present

## 2021-09-01 DIAGNOSIS — I4891 Unspecified atrial fibrillation: Secondary | ICD-10-CM | POA: Diagnosis not present

## 2021-09-01 DIAGNOSIS — I10 Essential (primary) hypertension: Secondary | ICD-10-CM | POA: Diagnosis not present

## 2021-09-01 DIAGNOSIS — G4733 Obstructive sleep apnea (adult) (pediatric): Secondary | ICD-10-CM | POA: Diagnosis not present

## 2021-09-01 DIAGNOSIS — E785 Hyperlipidemia, unspecified: Secondary | ICD-10-CM | POA: Diagnosis not present

## 2021-09-01 DIAGNOSIS — Z1211 Encounter for screening for malignant neoplasm of colon: Secondary | ICD-10-CM | POA: Diagnosis not present

## 2021-09-07 ENCOUNTER — Other Ambulatory Visit: Payer: Self-pay

## 2021-09-07 ENCOUNTER — Ambulatory Visit (HOSPITAL_COMMUNITY)
Admission: RE | Admit: 2021-09-07 | Discharge: 2021-09-07 | Disposition: A | Discharge status: Home / Self Care | Payer: Medicare Other | Source: Ambulatory Visit | Attending: Family Medicine | Admitting: Family Medicine

## 2021-09-07 DIAGNOSIS — M85852 Other specified disorders of bone density and structure, left thigh: Secondary | ICD-10-CM | POA: Insufficient documentation

## 2021-09-07 DIAGNOSIS — Z780 Asymptomatic menopausal state: Secondary | ICD-10-CM | POA: Diagnosis not present

## 2021-09-07 DIAGNOSIS — Z13820 Encounter for screening for osteoporosis: Secondary | ICD-10-CM | POA: Insufficient documentation

## 2021-09-08 ENCOUNTER — Encounter: Payer: Self-pay | Admitting: *Deleted

## 2021-09-09 ENCOUNTER — Telehealth: Payer: Self-pay | Admitting: Cardiology

## 2021-09-09 ENCOUNTER — Encounter: Payer: Self-pay | Admitting: Cardiology

## 2021-09-09 ENCOUNTER — Encounter: Payer: Self-pay | Admitting: *Deleted

## 2021-09-09 ENCOUNTER — Ambulatory Visit: Payer: Medicare Other | Admitting: Cardiology

## 2021-09-09 VITALS — BP 130/70 | HR 55 | Ht 63.0 in | Wt 185.0 lb

## 2021-09-09 DIAGNOSIS — I4892 Unspecified atrial flutter: Secondary | ICD-10-CM | POA: Diagnosis not present

## 2021-09-09 NOTE — Telephone Encounter (Signed)
PERCERT:    30 day event monitor - preventice - palps

## 2021-09-09 NOTE — Progress Notes (Signed)
Clinical Summary Felicia Wright is a 70 y.o.female seen as a new consult, referred by Dr Nevada Crane for the following medical problems.  1.Afib - diagnosis list in pcp note, details are unclear - apparently had been seeing Dr Cindie Laroche prior to current pcp, I do not have these records - had been on metoprolol but recently stopped due to low HRs - has not been on anticoagulation, unclear why not. She denies any recent bleeding issues.  - EKGs in epic reviewed, do not see afib. Do see aflutter in 08/12/2015.  -occasional palpitations at times.      2. HTN - compliant withmeds     Past Medical History:  Diagnosis Date   Abnormal heart rhythm    Accidental fall    Allergic rhinitis    Aphthous ulcer    Arthritis    Asthma    Bronchitis    Carpal tunnel syndrome    Cataract    Constipation    Contusion of second toe, right    Cystitis    Depression    Diabetes mellitus type II    diet controlled   Encounter for long-term (current) use of other medications    Fibroid uterus    GERD (gastroesophageal reflux disease)    Hip pain, left    Hyperlipidemia    Hypertension    IBS (irritable bowel syndrome)    Incontinence    Malaise and fatigue    Morbid obesity ([facility])    Normocytic anemia    Oral candidiasis    Overactive bladder    Preventative health care    Reactive depression (situational)    Seizure disorder ([facility])    Sleep apnea    Ulnar neuropathy    Left     Allergies  Allergen Reactions   Sertraline Hcl     Lips swelling & loose stools   Bee Venom    Peach [Prunus Persica]    Strawberry Extract      Current Outpatient Medications  Medication Sig Dispense Refill   acetaminophen (TYLENOL) 500 MG tablet Take 1,000 mg by mouth at bedtime as needed for pain.      aspirin 81 MG tablet Take 162 mg by mouth at bedtime.      BLACK CURRANT SEED OIL PO Take by mouth.     celecoxib (CELEBREX) 200 MG capsule Take 200 mg by mouth every morning.      cephALEXin  (KEFLEX) 250 MG capsule Take 1 capsule (250 mg total) by mouth 4 (four) times daily. 28 capsule 0   co-enzyme Q-10 30 MG capsule Take 30 mg by mouth 3 (three) times daily.     diphenhydrAMINE (BENADRYL) 25 mg capsule Take 50 mg by mouth at bedtime as needed for allergies or sleep.      LINZESS 72 MCG capsule Take 72 mcg by mouth daily.     magnesium gluconate (MAGONATE) 500 MG tablet Take 500 mg by mouth daily.     metoprolol tartrate (LOPRESSOR) 25 MG tablet TAKE 1 TABLET BY MOUTH TWICE DAILY. 60 tablet 0   Multiple Vitamin (MULTIVITAMIN WITH MINERALS) TABS Take 1 tablet by mouth daily.     mupirocin cream (BACTROBAN) 2 % Apply 1 application topically 2 (two) times daily. 15 g 0   omeprazole (PRILOSEC) 20 MG capsule Take 20 mg by mouth daily before breakfast.      Propylene Glycol-Glycerin (SOOTHE OP) Place 1 drop into the left eye daily.     simvastatin (ZOCOR) 40  MG tablet Take 40 mg by mouth at bedtime.     vitamin B-12 (CYANOCOBALAMIN) 500 MCG tablet Take 500 mcg by mouth daily.     No current facility-administered medications for this visit.     Past Surgical History:  Procedure Laterality Date   BREAST BIOPSY  03/07/2012   Procedure: BREAST BIOPSY WITH NEEDLE LOCALIZATION;  Surgeon: Harl Bowie, MD;  Location: [facility];  Service: General;  Laterality: Right;  needle localized right breast lumpectomy   BREAST EXCISIONAL BIOPSY Right    right   CARPAL TUNNEL RELEASE     Right   CHOLECYSTECTOMY     COLONOSCOPY N/A 08/12/2015   Procedure: COLONOSCOPY;  Surgeon: Rogene Houston, MD;  [facility];  Service: Endoscopy;  Laterality: N/A;  [place] SPUR SURGERY     Right   HEMORROIDECTOMY     KNEE ARTHROSCOPY     Right   NASAL SINUS SURGERY  1983   TONSILLECTOMY     TOTAL SHOULDER ARTHROPLASTY Right 02/28/2013   Procedure: RIGHT TOTAL SHOULDER ARTHROPLASTY;  Surgeon: Marin Shutter, MD;  Location: [facility];   Service: Orthopedics;  Laterality: Right;     Allergies  Allergen Reactions   Sertraline Hcl     Lips swelling & loose stools   Bee Venom    Peach [Prunus Persica]    Strawberry Extract       Family History  Problem Relation Age of Onset   Hypertension Mother    Heart failure Mother    Fibroids Sister    Hypertension Brother    Diabetes Brother    Arthritis Other    Coronary artery disease Other    Asthma Other      Social History Ms. Rhee reports that she quit smoking about 22 years ago. Her smoking use included cigarettes. She has a 20.00 pack-year smoking history. She has never used smokeless tobacco. Ms. Strauch reports no history of alcohol use.   Review of Systems CONSTITUTIONAL: No weight loss, fever, chills, weakness or fatigue.  HEENT: Eyes: No visual loss, blurred vision, double vision or yellow sclerae.No hearing loss, sneezing, congestion, runny nose or sore throat.  SKIN: No rash or itching.  CARDIOVASCULAR: per hpi RESPIRATORY: No shortness of breath, cough or sputum.  GASTROINTESTINAL: No anorexia, nausea, vomiting or diarrhea. No abdominal pain or blood.  GENITOURINARY: No burning on urination, no polyuria NEUROLOGICAL: No headache, dizziness, syncope, paralysis, ataxia, numbness or tingling in the extremities. No change in bowel or bladder control.  MUSCULOSKELETAL: No muscle, back pain, joint pain or stiffness.  LYMPHATICS: No enlarged nodes. No history of splenectomy.  PSYCHIATRIC: No history of depression or anxiety.  ENDOCRINOLOGIC: No reports of sweating, cold or heat intolerance. No polyuria or polydipsia.  Marland Kitchen   Physical Examination Today's Vitals   09/09/21 0901  BP: 130/70  Pulse: (!) 55  SpO2: 99%  Weight: 185 lb (83.9 kg)  Height: 5\' 3"  (1.6 m)   Body mass index is 32.77 kg/m.  Gen: resting comfortably, no acute distress HEENT: no scleral icterus, pupils equal round and reactive, no palptable cervical adenopathy,  CV: RRR, no  m/r/ gno jvd Resp: Clear to auscultation bilaterally GI: abdomen is soft, non-tender, non-distended, normal bowel sounds, no hepatosplenomegaly MSK: extremities are warm, no edema.  Skin: warm, no rash Neuro:  no focal deficits Psych: appropriate affect     Assessment and Plan  1.Afib/Aflutter -  don't have much information on this history other than a single EKG in 2016 showing aflutter - she had been on metoprolol but now off due to low HRs - it is not clear to me if discussions were ever had about anticoagulation, or if there was a reason it was not started in the past.  - will obtain 30 day event monitor to better evaluate her cardiac rhtyhm. If confirmed afib or aflutter would need to consider anticoag as her CHADS2Vasc score is 3.  EKG today shows sinus brady, PVC  2. HTN  At goal, continue current meds     Arnoldo Lenis, M.D.,

## 2021-09-09 NOTE — Patient Instructions (Addendum)
Medication Instructions:  Lopressor (Metoprolol tart) removed from list today.  Continue all other medications.     Labwork: none  Testing/Procedures: Your physician has recommended that you wear a 30 day event monitor. Event monitors are medical devices that record the heart's electrical activity. Doctors most often [place] these monitors to diagnose arrhythmias. Arrhythmias are problems with the speed or rhythm of the heartbeat. The monitor is a small, portable device. You can wear one while you do your normal daily activities. This is usually used to diagnose what is causing palpitations/syncope (passing out). Office will contact with results via phone or letter.    Follow-Up: 2 months   Any Other Special Instructions Will Be Listed Below (If Applicable).   If you need a refill on your cardiac medications before your next appointment, please call your pharmacy.

## 2021-09-15 DIAGNOSIS — I10 Essential (primary) hypertension: Secondary | ICD-10-CM | POA: Diagnosis not present

## 2021-09-15 DIAGNOSIS — E785 Hyperlipidemia, unspecified: Secondary | ICD-10-CM | POA: Diagnosis not present

## 2021-09-15 DIAGNOSIS — M1990 Unspecified osteoarthritis, unspecified site: Secondary | ICD-10-CM | POA: Diagnosis not present

## 2021-09-15 DIAGNOSIS — K219 Gastro-esophageal reflux disease without esophagitis: Secondary | ICD-10-CM | POA: Diagnosis not present

## 2021-09-15 DIAGNOSIS — K5900 Constipation, unspecified: Secondary | ICD-10-CM | POA: Diagnosis not present

## 2021-09-17 ENCOUNTER — Ambulatory Visit (INDEPENDENT_AMBULATORY_CARE_PROVIDER_SITE_OTHER): Payer: Medicare Other

## 2021-09-17 DIAGNOSIS — R002 Palpitations: Secondary | ICD-10-CM

## 2021-09-17 DIAGNOSIS — I4891 Unspecified atrial fibrillation: Secondary | ICD-10-CM

## 2021-09-17 DIAGNOSIS — I4892 Unspecified atrial flutter: Secondary | ICD-10-CM

## 2021-09-21 ENCOUNTER — Other Ambulatory Visit: Payer: Self-pay | Admitting: *Deleted

## 2021-09-21 DIAGNOSIS — I4891 Unspecified atrial fibrillation: Secondary | ICD-10-CM

## 2021-09-21 DIAGNOSIS — I4892 Unspecified atrial flutter: Secondary | ICD-10-CM

## 2021-11-03 ENCOUNTER — Telehealth: Payer: Self-pay | Admitting: *Deleted

## 2021-11-03 NOTE — Telephone Encounter (Signed)
-----   Message from Arnoldo Lenis, MD sent at 11/01/2021 11:04 AM EST ----- Heart monitor did not show any afib or aflutter, she does have some occasoinal extra heart beats that at times can have a few in a row which is not dangerous but can cause some feelings of heart racing or skipping. No changes at this time, will discuss again in detail at our f/u in early Jan  J Branch MD

## 2021-11-03 NOTE — Telephone Encounter (Signed)
Laurine Blazer, LPN  [id number]/07/[id number]  6:28 AM EST Back to Top    Notified, copy to pcp.

## 2021-11-16 ENCOUNTER — Ambulatory Visit: Payer: Medicare Other | Admitting: Cardiology

## 2021-11-16 ENCOUNTER — Encounter: Payer: Self-pay | Admitting: Cardiology

## 2021-11-16 VITALS — BP 124/60 | HR 54 | Ht 63.0 in | Wt 186.0 lb

## 2021-11-16 DIAGNOSIS — I4892 Unspecified atrial flutter: Secondary | ICD-10-CM | POA: Diagnosis not present

## 2021-11-16 DIAGNOSIS — I10 Essential (primary) hypertension: Secondary | ICD-10-CM

## 2021-11-16 DIAGNOSIS — I4891 Unspecified atrial fibrillation: Secondary | ICD-10-CM | POA: Diagnosis not present

## 2021-11-16 NOTE — Progress Notes (Signed)
Clinical Summary Ms. Rost is a 71 y.o.female seen today for follow up of the following medical problems.   1.Afib/aflutter - diagnosis list in pcp note, details are unclear - apparently had been seeing Dr Cindie Laroche prior to current pcp, I do not have these records - had been on metoprolol but recently stopped due to low HRs - has not been on anticoagulation, unclear why not. She denies any recent bleeding issues.  - EKGs in epic reviewed, do not see afib. Do see aflutter in 08/12/2015.       09/2021 30 day monitor: no afib or aflutter - no recent symptoms   2. HTN - compliant withmeds  123/67 at home. At last visit 130/70   3.Preoperative evaluation - possible right knee replacement - rides stationary bike x 40 minutes. Walks up to 60 minutes.  Past Medical History:  Diagnosis Date   Abnormal heart rhythm    Accidental fall    Allergic rhinitis    Aphthous ulcer    Arthritis    Asthma    Bronchitis    Carpal tunnel syndrome    Cataract    Constipation    Contusion of second toe, right    Cystitis    Depression    Diabetes mellitus type II    diet controlled   Encounter for long-term (current) use of other medications    Fibroid uterus    GERD (gastroesophageal reflux disease)    Hip pain, left    Hyperlipidemia    Hypertension    IBS (irritable bowel syndrome)    Incontinence    Malaise and fatigue    Morbid obesity (HCC)    Normocytic anemia    Oral candidiasis    Overactive bladder    Preventative health care    Reactive depression (situational)    Seizure disorder (HCC)    Sleep apnea    Ulnar neuropathy    Left     Allergies  Allergen Reactions   Sertraline Hcl     Lips swelling & loose stools   Bee Venom    Peach [Prunus Persica]    Strawberry Extract      Current Outpatient Medications  Medication Sig Dispense Refill   acetaminophen (TYLENOL) 500 MG tablet Take 1,000 mg by mouth at bedtime as needed for pain.      amLODipine  (NORVASC) 5 MG tablet Take 5 mg by mouth daily.     aspirin 81 MG tablet Take 162 mg by mouth at bedtime.      celecoxib (CELEBREX) 200 MG capsule Take 200 mg by mouth every morning.      co-enzyme Q-10 30 MG capsule Take 30 mg by mouth 3 (three) times daily.     diphenhydrAMINE (BENADRYL) 25 mg capsule Take 50 mg by mouth at bedtime as needed for allergies or sleep.      LINZESS 72 MCG capsule Take 72 mcg by mouth daily.     lisinopril (ZESTRIL) 40 MG tablet Take 40 mg by mouth daily.     magnesium gluconate (MAGONATE) 500 MG tablet Take 500 mg by mouth daily.     Multiple Vitamin (MULTIVITAMIN WITH MINERALS) TABS Take 1 tablet by mouth daily.     omeprazole (PRILOSEC) 20 MG capsule Take 20 mg by mouth daily before breakfast.      Propylene Glycol-Glycerin (SOOTHE OP) Place 1 drop into the left eye daily.     simvastatin (ZOCOR) 40 MG tablet Take 40 mg by mouth  at bedtime.     vitamin B-12 (CYANOCOBALAMIN) 500 MCG tablet Take 500 mcg by mouth daily.     No current facility-administered medications for this visit.     Past Surgical History:  Procedure Laterality Date   BREAST BIOPSY  03/07/2012   Procedure: BREAST BIOPSY WITH NEEDLE LOCALIZATION;  Surgeon: Harl Bowie, MD;  Location: [facility];  Service: General;  Laterality: Right;  needle localized right breast lumpectomy   BREAST EXCISIONAL BIOPSY Right    right   CARPAL TUNNEL RELEASE     Right   CHOLECYSTECTOMY     COLONOSCOPY N/A 08/12/2015   Procedure: COLONOSCOPY;  Surgeon: Rogene Houston, MD;  [facility];  Service: Endoscopy;  Laterality: N/A;  [place] SPUR SURGERY     Right   HEMORROIDECTOMY     KNEE ARTHROSCOPY     Right   NASAL SINUS SURGERY  1983   TONSILLECTOMY     TOTAL SHOULDER ARTHROPLASTY Right 02/28/2013   Procedure: RIGHT TOTAL SHOULDER ARTHROPLASTY;  Surgeon: Marin Shutter, MD;  Location: [facility];  Service: Orthopedics;  Laterality:  Right;     Allergies  Allergen Reactions   Sertraline Hcl     Lips swelling & loose stools   Bee Venom    Peach [Prunus Persica]    Strawberry Extract       Family History  Problem Relation Age of Onset   Hypertension Mother    Heart failure Mother    Fibroids Sister    Hypertension Brother    Diabetes Brother    Arthritis Other    Coronary artery disease Other    Asthma Other      Social History Ms. Maser reports that she quit smoking about 22 years ago. Her smoking use included cigarettes. She has a 20.00 pack-year smoking history. She has never used smokeless tobacco. Ms. Gharibian reports no history of alcohol use.   Review of Systems CONSTITUTIONAL: No weight loss, fever, chills, weakness or fatigue.  HEENT: Eyes: No visual loss, blurred vision, double vision or yellow sclerae.No hearing loss, sneezing, congestion, runny nose or sore throat.  SKIN: No rash or itching.  CARDIOVASCULAR: per hpi RESPIRATORY: No shortness of breath, cough or sputum.  GASTROINTESTINAL: No anorexia, nausea, vomiting or diarrhea. No abdominal pain or blood.  GENITOURINARY: No burning on urination, no polyuria NEUROLOGICAL: No headache, dizziness, syncope, paralysis, ataxia, numbness or tingling in the extremities. No change in bowel or bladder control.  MUSCULOSKELETAL: No muscle, back pain, joint pain or stiffness.  LYMPHATICS: No enlarged nodes. No history of splenectomy.  PSYCHIATRIC: No history of depression or anxiety.  ENDOCRINOLOGIC: No reports of sweating, cold or heat intolerance. No polyuria or polydipsia.  Marland Kitchen   Physical Examination Today's Vitals   11/16/21 1103  BP: (!) 152/70  Pulse: (!) 54  SpO2: 98%  Weight: 186 lb (84.4 kg)  Height: 5\' 3"  (1.6 m)   Body mass index is 32.95 kg/m.  Gen: resting comfortably, no acute distress HEENT: no scleral icterus, pupils equal round and reactive, no palptable cervical adenopathy,  CV: RRR, no m/r/g no jvd Resp: Clear to  auscultation bilaterally GI: abdomen is soft, non-tender, non-distended, normal bowel sounds, no hepatosplenomegaly MSK: extremities are warm, no edema.  Skin: warm, no rash Neuro:  no focal deficits Psych: appropriate affect   Diagnostic Studies 09/2021 30 day monitor 30 day monitor Min HR 38, Max  HR 131, Avg HR 61 Telemetry tracings show sinus rhythm with PVCs. Short runs of NSVT up to 5 beats.   Assessment and Plan  1.Afib/Aflutter -don't have much information on this history other than a single EKG in 2016 showing aflutter - she had been on metoprolol but now off due to low HRs - it is not clear to me if discussions were ever had about anticoagulation, or if there was a reason it was not started in the past.  - recent 30 day monitor was benign. We have not seen evidence of recurrence, continue to monitor at this time. If noted again would need to start anticoag   2. HTN -she is at goal, continue current meds      Arnoldo Lenis, M.D

## 2021-11-16 NOTE — Patient Instructions (Addendum)

## 2021-11-29 DIAGNOSIS — E559 Vitamin D deficiency, unspecified: Secondary | ICD-10-CM | POA: Diagnosis not present

## 2021-11-29 DIAGNOSIS — Z79899 Other long term (current) drug therapy: Secondary | ICD-10-CM | POA: Diagnosis not present

## 2021-11-29 DIAGNOSIS — I10 Essential (primary) hypertension: Secondary | ICD-10-CM | POA: Diagnosis not present

## 2021-12-02 DIAGNOSIS — I10 Essential (primary) hypertension: Secondary | ICD-10-CM | POA: Diagnosis not present

## 2021-12-02 DIAGNOSIS — D696 Thrombocytopenia, unspecified: Secondary | ICD-10-CM | POA: Diagnosis not present

## 2021-12-02 DIAGNOSIS — G4733 Obstructive sleep apnea (adult) (pediatric): Secondary | ICD-10-CM | POA: Diagnosis not present

## 2021-12-02 DIAGNOSIS — M8580 Other specified disorders of bone density and structure, unspecified site: Secondary | ICD-10-CM | POA: Diagnosis not present

## 2021-12-02 DIAGNOSIS — E785 Hyperlipidemia, unspecified: Secondary | ICD-10-CM | POA: Diagnosis not present

## 2021-12-02 DIAGNOSIS — D649 Anemia, unspecified: Secondary | ICD-10-CM | POA: Diagnosis not present

## 2021-12-02 DIAGNOSIS — M1711 Unilateral primary osteoarthritis, right knee: Secondary | ICD-10-CM | POA: Diagnosis not present

## 2021-12-02 DIAGNOSIS — I4891 Unspecified atrial fibrillation: Secondary | ICD-10-CM | POA: Diagnosis not present

## 2021-12-02 DIAGNOSIS — E559 Vitamin D deficiency, unspecified: Secondary | ICD-10-CM | POA: Diagnosis not present

## 2021-12-09 DIAGNOSIS — G4733 Obstructive sleep apnea (adult) (pediatric): Secondary | ICD-10-CM | POA: Diagnosis not present

## 2022-05-16 ENCOUNTER — Encounter: Payer: Self-pay | Admitting: Cardiology

## 2022-05-16 ENCOUNTER — Ambulatory Visit: Payer: Medicare Other | Admitting: Cardiology

## 2022-05-16 VITALS — BP 124/66 | HR 60 | Ht 63.0 in | Wt 181.6 lb

## 2022-05-16 DIAGNOSIS — Z01810 Encounter for preprocedural cardiovascular examination: Secondary | ICD-10-CM

## 2022-05-16 DIAGNOSIS — I4891 Unspecified atrial fibrillation: Secondary | ICD-10-CM

## 2022-05-16 DIAGNOSIS — I10 Essential (primary) hypertension: Secondary | ICD-10-CM | POA: Diagnosis not present

## 2022-05-16 NOTE — Progress Notes (Signed)
Clinical Summary Felicia Wright is a 71 y.o.female seen today for follow up of the following medical problems.    1.Afib/aflutter - diagnosis list in pcp note, details are unclear - apparently had been seeing Dr Cindie Laroche prior to current pcp, I do not have these records - had been on metoprolol but recently stopped due to low HRs - has not been on anticoagulation, unclear why not. She denies any recent bleeding issues.  - EKGs in epic reviewed, do not see afib. Do see aflutter in 08/12/2015.       09/2021 30 day monitor: no afib or aflutter - resting HRs low 50s, no symptoms - no recent palpitations.     2. HTN - compliant withmeds   123/67 at home. At last visit 130/70  - home bp's 120s-130s/60s, HRs low 50s.    - some lightheadness at night though mild   3.Preoperative evaluation - possible right knee replacement - rides stationary bike x 40 minutes. Walks up to 60 minutes.   - 2 weeks ago rode stationary bike x 30 min Past Medical History:  Diagnosis Date   Abnormal heart rhythm    Accidental fall    Allergic rhinitis    Aphthous ulcer    Arthritis    Asthma    Bronchitis    Carpal tunnel syndrome    Cataract    Constipation    Contusion of second toe, right    Cystitis    Depression    Diabetes mellitus type II    diet controlled   Encounter for long-term (current) use of other medications    Fibroid uterus    GERD (gastroesophageal reflux disease)    Hip pain, left    Hyperlipidemia    Hypertension    IBS (irritable bowel syndrome)    Incontinence    Malaise and fatigue    Morbid obesity (HCC)    Normocytic anemia    Oral candidiasis    Overactive bladder    Preventative health care    Reactive depression (situational)    Seizure disorder (HCC)    Sleep apnea    Ulnar neuropathy    Left     Allergies  Allergen Reactions   Sertraline Hcl     Lips swelling & loose stools   Bee Venom    Peach [Prunus Persica]    Strawberry Extract       Current Outpatient Medications  Medication Sig Dispense Refill   acetaminophen (TYLENOL) 500 MG tablet Take 1,000 mg by mouth at bedtime as needed for pain.      amLODipine (NORVASC) 5 MG tablet Take 5 mg by mouth daily.     aspirin 81 MG tablet Take 162 mg by mouth at bedtime.      celecoxib (CELEBREX) 200 MG capsule Take 200 mg by mouth every morning.      co-enzyme Q-10 30 MG capsule Take 30 mg by mouth 3 (three) times daily.     diphenhydrAMINE (BENADRYL) 25 mg capsule Take 50 mg by mouth at bedtime as needed for allergies or sleep.      LINZESS 72 MCG capsule Take 72 mcg by mouth daily.     lisinopril (ZESTRIL) 40 MG tablet Take 40 mg by mouth daily.     magnesium gluconate (MAGONATE) 500 MG tablet Take 500 mg by mouth daily.     Multiple Vitamin (MULTIVITAMIN WITH MINERALS) TABS Take 1 tablet by mouth daily.     omeprazole (PRILOSEC) 20 MG  capsule Take 20 mg by mouth daily before breakfast.      Propylene Glycol-Glycerin (SOOTHE OP) Place 1 drop into the left eye daily.     simvastatin (ZOCOR) 40 MG tablet Take 40 mg by mouth at bedtime.     vitamin B-12 (CYANOCOBALAMIN) 500 MCG tablet Take 500 mcg by mouth daily.     No current facility-administered medications for this visit.     Past Surgical History:  Procedure Laterality Date   BREAST BIOPSY  03/07/2012   Procedure: BREAST BIOPSY WITH NEEDLE LOCALIZATION;  Surgeon: Harl Bowie, MD;  Location: [place] [place];  Service: General;  Laterality: Right;  needle localized right breast lumpectomy   BREAST EXCISIONAL BIOPSY Right    right   CARPAL TUNNEL RELEASE     Right   CHOLECYSTECTOMY     COLONOSCOPY N/A 08/12/2015   Procedure: COLONOSCOPY;  Surgeon: Rogene Houston, MD;  [facility];  Service: Endoscopy;  Laterality: N/A;  [place] SPUR SURGERY     Right   HEMORROIDECTOMY     KNEE ARTHROSCOPY     Right   NASAL SINUS SURGERY  1983   TONSILLECTOMY      TOTAL SHOULDER ARTHROPLASTY Right 02/28/2013   Procedure: RIGHT TOTAL SHOULDER ARTHROPLASTY;  Surgeon: Marin Shutter, MD;  Location: [place];  Service: Orthopedics;  Laterality: Right;     Allergies  Allergen Reactions   Sertraline Hcl     Lips swelling & loose stools   Bee Venom    Peach [Prunus Persica]    Strawberry Extract       Family History  Problem Relation Age of Onset   Hypertension Mother    Heart failure Mother    Fibroids Sister    Hypertension Brother    Diabetes Brother    Arthritis Other    Coronary artery disease Other    Asthma Other      Social History Ms. Pawley reports that she quit smoking about 23 years ago. Her smoking use included cigarettes. She has a 20.00 pack-year smoking history. She has never used smokeless tobacco. Ms. Gutzwiller reports no history of alcohol use.   Review of Systems CONSTITUTIONAL: No weight loss, fever, chills, weakness or fatigue.  HEENT: Eyes: No visual loss, blurred vision, double vision or yellow sclerae.No hearing loss, sneezing, congestion, runny nose or sore throat.  SKIN: No rash or itching.  CARDIOVASCULAR: per hpi RESPIRATORY: No shortness of breath, cough or sputum.  GASTROINTESTINAL: No anorexia, nausea, vomiting or diarrhea. No abdominal pain or blood.  GENITOURINARY: No burning on urination, no polyuria NEUROLOGICAL: No headache, dizziness, syncope, paralysis, ataxia, numbness or tingling in the extremities. No change in bowel or bladder control.  MUSCULOSKELETAL: + knee pain LYMPHATICS: No enlarged nodes. No history of splenectomy.  PSYCHIATRIC: No history of depression or anxiety.  ENDOCRINOLOGIC: No reports of sweating, cold or heat intolerance. No polyuria or polydipsia.  Marland Kitchen   Physical Examination Today's Vitals   05/16/22 1356  BP: 124/66  Pulse: 60  SpO2: 98%  Weight: 181 lb 9.6 oz (82.4 kg)  Height: '5\' 3"'$  (1.6 m)   Body mass index is 32.17 kg/m.  Gen: resting comfortably, no acute  distress HEENT: no scleral icterus, pupils equal round and reactive, no palptable cervical adenopathy,  CV: RRR, no m/r/ gno jvd Resp: Clear to auscultation bilaterally GI: abdomen is soft, non-tender, non-distended, normal bowel sounds, no hepatosplenomegaly  MSK: extremities are warm, no edema.  Skin: warm, no rash Neuro:  no focal deficits Psych: appropriate affect   Diagnostic Studies   09/2021 30 day monitor 30 day monitor Min HR 38, Max HR 131, Avg HR 61 Telemetry tracings show sinus rhythm with PVCs. Short runs of NSVT up to 5 beats.  Assessment and Plan   1.Afib/Aflutter -don't have much information on this history other than a single EKG in 2016 showing aflutter - she had been on metoprolol but now off due to low HRs - it is not clear to me if discussions were ever had about anticoagulation, or if there was a reason it was not started in the past.  - recent 30 day monitor was benign. We have not seen evidence of recurrence - EKG today shows SR, PVCs. Continue to monitor for any recurrence, if noted would require anticoag     2. HTN -at goal, continue current meds   3. Preoperative evaluation - considering knee replacement - tolerates greater than 4 METs without symptoms, from cardiac standpoint ok to proceed with surgery.        Arnoldo Lenis, M.D.

## 2022-05-16 NOTE — Patient Instructions (Signed)
Medication Instructions:  Continue all current medications.   Labwork: none  Testing/Procedures: none  Follow-Up: 6 months   Any Other Special Instructions Will Be Listed Below (If Applicable).   If you need a refill on your cardiac medications before your next appointment, please call your pharmacy.  

## 2022-05-27 DIAGNOSIS — E559 Vitamin D deficiency, unspecified: Secondary | ICD-10-CM | POA: Diagnosis not present

## 2022-05-27 DIAGNOSIS — I10 Essential (primary) hypertension: Secondary | ICD-10-CM | POA: Diagnosis not present

## 2022-05-27 DIAGNOSIS — R7301 Impaired fasting glucose: Secondary | ICD-10-CM | POA: Diagnosis not present

## 2022-06-06 DIAGNOSIS — I10 Essential (primary) hypertension: Secondary | ICD-10-CM | POA: Diagnosis not present

## 2022-06-06 DIAGNOSIS — D649 Anemia, unspecified: Secondary | ICD-10-CM | POA: Diagnosis not present

## 2022-06-06 DIAGNOSIS — E785 Hyperlipidemia, unspecified: Secondary | ICD-10-CM | POA: Diagnosis not present

## 2022-06-06 DIAGNOSIS — M8580 Other specified disorders of bone density and structure, unspecified site: Secondary | ICD-10-CM | POA: Diagnosis not present

## 2022-06-06 DIAGNOSIS — G4733 Obstructive sleep apnea (adult) (pediatric): Secondary | ICD-10-CM | POA: Diagnosis not present

## 2022-06-06 DIAGNOSIS — E559 Vitamin D deficiency, unspecified: Secondary | ICD-10-CM | POA: Diagnosis not present

## 2022-06-06 DIAGNOSIS — M1990 Unspecified osteoarthritis, unspecified site: Secondary | ICD-10-CM | POA: Diagnosis not present

## 2022-06-06 DIAGNOSIS — I4891 Unspecified atrial fibrillation: Secondary | ICD-10-CM | POA: Diagnosis not present

## 2022-06-06 DIAGNOSIS — D696 Thrombocytopenia, unspecified: Secondary | ICD-10-CM | POA: Diagnosis not present

## 2022-06-06 DIAGNOSIS — K5900 Constipation, unspecified: Secondary | ICD-10-CM | POA: Diagnosis not present

## 2022-06-06 DIAGNOSIS — M1711 Unilateral primary osteoarthritis, right knee: Secondary | ICD-10-CM | POA: Diagnosis not present

## 2022-06-21 ENCOUNTER — Other Ambulatory Visit: Payer: Self-pay | Admitting: Internal Medicine

## 2022-06-21 DIAGNOSIS — Z1231 Encounter for screening mammogram for malignant neoplasm of breast: Secondary | ICD-10-CM

## 2022-07-29 ENCOUNTER — Ambulatory Visit
Admission: RE | Admit: 2022-07-29 | Discharge: 2022-07-29 | Disposition: A | Discharge status: Home / Self Care | Payer: Medicare Other | Source: Ambulatory Visit | Attending: Internal Medicine | Admitting: Internal Medicine

## 2022-07-29 DIAGNOSIS — Z1231 Encounter for screening mammogram for malignant neoplasm of breast: Secondary | ICD-10-CM | POA: Diagnosis not present

## 2022-08-05 DIAGNOSIS — Z13820 Encounter for screening for osteoporosis: Secondary | ICD-10-CM | POA: Diagnosis not present

## 2022-08-05 DIAGNOSIS — Z1231 Encounter for screening mammogram for malignant neoplasm of breast: Secondary | ICD-10-CM | POA: Diagnosis not present

## 2022-08-05 DIAGNOSIS — Z0000 Encounter for general adult medical examination without abnormal findings: Secondary | ICD-10-CM | POA: Diagnosis not present

## 2022-08-05 DIAGNOSIS — I10 Essential (primary) hypertension: Secondary | ICD-10-CM | POA: Diagnosis not present

## 2022-09-03 ENCOUNTER — Ambulatory Visit
Admission: EM | Admit: 2022-09-03 | Discharge: 2022-09-03 | Disposition: A | Discharge status: Home / Self Care | Payer: Medicare Other | Attending: Nurse Practitioner | Admitting: Nurse Practitioner

## 2022-09-03 ENCOUNTER — Encounter: Payer: Self-pay | Admitting: Emergency Medicine

## 2022-09-03 DIAGNOSIS — Z1152 Encounter for screening for COVID-19: Secondary | ICD-10-CM | POA: Diagnosis not present

## 2022-09-03 DIAGNOSIS — R0989 Other specified symptoms and signs involving the circulatory and respiratory systems: Secondary | ICD-10-CM | POA: Insufficient documentation

## 2022-09-03 DIAGNOSIS — Z8709 Personal history of other diseases of the respiratory system: Secondary | ICD-10-CM

## 2022-09-03 DIAGNOSIS — U071 COVID-19: Secondary | ICD-10-CM | POA: Diagnosis not present

## 2022-09-03 LAB — RESP PANEL BY RT-PCR (RSV, FLU A&B, COVID)  RVPGX2
Influenza A by PCR: NEGATIVE
Influenza B by PCR: NEGATIVE
Resp Syncytial Virus by PCR: NEGATIVE
SARS Coronavirus 2 by RT PCR: POSITIVE — AB

## 2022-09-03 MED ORDER — CETIRIZINE HCL 10 MG PO TABS: 10.0000 mg | ORAL_TABLET | Freq: Every day | ORAL | 0 refills | Status: DC

## 2022-09-03 MED ORDER — FLUTICASONE PROPIONATE 50 MCG/ACT NA SUSP: 2.0000 | Freq: Every day | NASAL | 0 refills | Status: DC

## 2022-09-03 MED ORDER — BENZONATATE 100 MG PO CAPS: 100.0000 mg | ORAL_CAPSULE | Freq: Three times a day (TID) | ORAL | 0 refills | Status: DC | PRN

## 2022-09-03 NOTE — ED Triage Notes (Signed)
Cough, sneezing and nasal congestion since last night.  C/o headache

## 2022-09-03 NOTE — ED Provider Notes (Signed)
[facility]    CSN: [id number] Arrival date & time: 09/03/22  1761      History   Chief Complaint No chief complaint on file.   HPI Felicia Wright is a 71 y.o. female.   The history is provided by the patient.   Patient presents with a 1 day history of headache, cough, sneezing, nasal congestion, and runny nose.  Patient denies fever, chills, sore throat, wheezing, shortness of breath, difficulty breathing, or GI symptoms.  Patient states that the cough kept her from sleeping.  She states that she is unable to use her CPAP to the coughing.  Reports that her brother has been sick.  Patient has been vaccinated for COVID, flu, and RSV.  She states that she does have a history of allergies.  Normally takes Benadryl for her symptoms.  Past Medical History:  Diagnosis Date   Abnormal heart rhythm    Accidental fall    Allergic rhinitis    Aphthous ulcer    Arthritis    Asthma    Bronchitis    Carpal tunnel syndrome    Cataract    Constipation    Contusion of second toe, right    Cystitis    Depression    Diabetes mellitus type II    diet controlled   Encounter for long-term (current) use of other medications    Fibroid uterus    GERD (gastroesophageal reflux disease)    Hip pain, left    Hyperlipidemia    Hypertension    IBS (irritable bowel syndrome)    Incontinence    Malaise and fatigue    Morbid obesity (HCC)    Normocytic anemia    Oral candidiasis    Overactive bladder    Preventative health care    Reactive depression (situational)    Seizure disorder ([place])    Sleep apnea    Ulnar neuropathy    Left    Patient Active Problem List   Diagnosis Date Noted   Pain in joint, shoulder region 04/02/2013   Muscle weakness (generalized) 04/02/2013   Shoulder arthritis 02/28/2013   Breast calcifications on mammogram 02/16/2012   KNEE, ARTHRITIS, DEGEN./OSTEO 05/28/2009   MORBID OBESITY 01/09/2007   DIABETES MELLITUS, TYPE II, CONTROLLED 12/27/2006    FIBROIDS, UTERUS 10/10/2006   HYPERLIPIDEMIA 10/10/2006   CARPAL TUNNEL SYNDROME 10/10/2006   CATARACT NOS 10/10/2006   HYPERTENSION 10/10/2006   ALLERGIC RHINITIS 10/10/2006   ASTHMA 10/10/2006   GERD 10/10/2006   IBS 10/10/2006   OVERACTIVE BLADDER 10/10/2006   ARTHRITIS 10/10/2006    Past Surgical History:  Procedure Laterality Date   BREAST BIOPSY  03/07/2012   Procedure: BREAST BIOPSY WITH NEEDLE LOCALIZATION;  Surgeon: Harl Bowie, MD;  Location: [place];  Service: General;  Laterality: Right;  needle localized right breast lumpectomy   BREAST EXCISIONAL BIOPSY Right    right   CARPAL TUNNEL RELEASE     Right   CHOLECYSTECTOMY     COLONOSCOPY N/A 08/12/2015   Procedure: COLONOSCOPY;  Surgeon: Rogene Houston, MD;  [facility];  Service: Endoscopy;  Laterality: N/A;  [place] SPUR SURGERY     Right   HEMORROIDECTOMY     KNEE ARTHROSCOPY     Right   NASAL SINUS SURGERY  1983   TONSILLECTOMY     TOTAL SHOULDER ARTHROPLASTY Right 02/28/2013   Procedure: RIGHT TOTAL SHOULDER ARTHROPLASTY;  Surgeon:  Marin Shutter, MD;  Location: [place];  Service: Orthopedics;  Laterality: Right;    OB History   No obstetric history on file.      Home Medications    Prior to Admission medications   Medication Sig Start Date End Date Taking? Authorizing Provider  benzonatate (TESSALON) 100 MG capsule Take 1 capsule (100 mg total) by mouth 3 (three) times daily as needed for cough. 09/03/22  Yes Broden Holt-Warren, Alda Lea, NP  cetirizine (ZYRTEC) 10 MG tablet Take 1 tablet (10 mg total) by mouth daily. 09/03/22  Yes Ulysees Robarts-Warren, Alda Lea, NP  fluticasone (FLONASE) 50 MCG/ACT nasal spray Place 2 sprays into both nostrils daily. 09/03/22  Yes Jamilyn Pigeon-Warren, Alda Lea, NP  acetaminophen (TYLENOL) 500 MG tablet Take 1,000 mg by mouth at bedtime as needed for pain.     [provider]  amLODipine (NORVASC) 5  MG tablet Take 5 mg by mouth daily. 09/01/21   [provider]  aspirin 81 MG tablet Take 81 mg by mouth at bedtime.    [provider]  celecoxib (CELEBREX) 200 MG capsule Take 200 mg by mouth every morning.     [provider]  co-enzyme Q-10 30 MG capsule Take 30 mg by mouth 3 (three) times daily.    [provider]  diphenhydrAMINE (BENADRYL) 25 mg capsule Take 50 mg by mouth at bedtime as needed for allergies or sleep.     [provider]  LINZESS 72 MCG capsule Take 72 mcg by mouth daily. 09/18/19   [provider]  lisinopril (ZESTRIL) 40 MG tablet Take 40 mg by mouth daily. 08/10/21   [provider]  magnesium gluconate (MAGONATE) 500 MG tablet Take 500 mg by mouth daily.    [provider]  Multiple Vitamin (MULTIVITAMIN WITH MINERALS) TABS Take 1 tablet by mouth daily.    [provider]  omeprazole (PRILOSEC) 20 MG capsule Take 20 mg by mouth daily before breakfast.     [provider]  Propylene Glycol-Glycerin (SOOTHE OP) Place 1 drop into the left eye daily.    [provider]  simvastatin (ZOCOR) 40 MG tablet Take 40 mg by mouth at bedtime. 09/18/19   [provider]  vitamin B-12 (CYANOCOBALAMIN) 500 MCG tablet Take 500 mcg by mouth daily.    [provider]    Family History Family History  Problem Relation Age of Onset   Hypertension Mother    Heart failure Mother    Fibroids Sister    Hypertension Brother    Diabetes Brother    Arthritis Other    Coronary artery disease Other    Asthma Other     Social History Social History   Tobacco Use   Smoking status: Former    Packs/day: 2.00    Years: 10.00    Total pack years: 20.00    Types: Cigarettes    Quit date: 03/03/1999    Years since quitting: 23.5   Smokeless tobacco: Never  Vaping Use   Vaping Use: Never used  Substance Use Topics   Alcohol use: No   Drug use: No     Allergies    Sertraline hcl, Bee venom, Peach [prunus persica], and Strawberry extract   Review of Systems Review of Systems Per HPI  Physical Exam Triage Vital Signs ED Triage Vitals  Enc Vitals Group     BP 09/03/22 1018 (!) 146/64     Pulse Rate 09/03/22 1018 64  Resp 09/03/22 1018 18     Temp 09/03/22 1018 99.1 F (37.3 C)     Temp Source 09/03/22 1018 Oral     SpO2 09/03/22 1018 99 %     Weight --      Height --      Head Circumference --      Peak Flow --      Pain Score 09/03/22 1020 5     Pain Loc --      Pain Edu? --      Excl. in [place]? --    No data found.  Updated Vital Signs BP (!) 146/64 (BP Location: Right Arm)   Pulse 64   Temp 99.1 F (37.3 C) (Oral)   Resp 18   SpO2 99%   Visual Acuity Right Eye Distance:   Left Eye Distance:   Bilateral Distance:    Right Eye Near:   Left Eye Near:    Bilateral Near:     Physical Exam Vitals and nursing note reviewed.  Constitutional:      General: She is not in acute distress.    Appearance: Normal appearance.  HENT:     Head: Normocephalic.     Right Ear: Tympanic membrane, ear canal and external ear normal.     Left Ear: Tympanic membrane, ear canal and external ear normal.     Nose: Congestion and rhinorrhea present.     Right Turbinates: Enlarged and swollen.     Left Turbinates: Enlarged and swollen.     Right Sinus: No maxillary sinus tenderness or frontal sinus tenderness.     Left Sinus: No maxillary sinus tenderness or frontal sinus tenderness.     Mouth/Throat:     Lips: Pink.     Mouth: Mucous membranes are moist.     Pharynx: Oropharynx is clear. Posterior oropharyngeal erythema present. No pharyngeal swelling, oropharyngeal exudate or uvula swelling.     Comments: Cobblestoning present to posterior tongue Eyes:     Extraocular Movements: Extraocular movements intact.     Conjunctiva/sclera: Conjunctivae normal.     Pupils: Pupils are equal, round, and reactive to light.  Cardiovascular:      Rate and Rhythm: Normal rate and regular rhythm.     Pulses: Normal pulses.     Heart sounds: Normal heart sounds.  Pulmonary:     Effort: Pulmonary effort is normal.     Breath sounds: Normal breath sounds.  Abdominal:     General: Bowel sounds are normal.     Palpations: Abdomen is soft.     Tenderness: There is no abdominal tenderness.  Musculoskeletal:     Cervical back: Normal range of motion.  Lymphadenopathy:     Cervical: No cervical adenopathy.  Skin:    General: Skin is warm and dry.  Neurological:     General: No focal deficit present.     Mental Status: She is alert and oriented to person, place, and time.  Psychiatric:        Mood and Affect: Mood normal.        Behavior: Behavior normal.      UC Treatments / Results  Labs (all labs ordered are listed, but only abnormal results are displayed) Labs Reviewed  RESP PANEL BY RT-PCR (RSV, FLU A&B, COVID)  [id number]    EKG   Radiology No results found.  Procedures Procedures (including critical care time)  Medications Ordered in UC Medications - No data to display  Initial Impression / Assessment and  Plan / UC Course  I have reviewed the triage vital signs and the nursing notes.  Pertinent labs & imaging results that were available during my care of the patient were reviewed by me and considered in my medical decision making (see chart for details).  Patient presents for upper respiratory symptoms that started over the past 24 hours.  Patient's vital signs are stable, she is in no acute distress.  COVID/flu/RSV test is pending at this time.  Given her history of seasonal allergies, her allergic rhinitis symptoms are most likely causing her symptoms.  This will be informed when her pending test results are received.  If the patient does test positive for COVID, she is a candidate to receive molnupiravir.  Patient was started on symptomatic treatment to include benzonatate 100 mg for the cough, fluticasone nasal  spray for nasal congestion, and cetirizine 10 mg for allergy symptoms.  Supportive care recommendations were provided to the patient along with strict return precautions.  Patient verbalizes understanding.  All questions were answered.  Patient is stable for discharge. Final Clinical Impressions(s) / UC Diagnoses   Final diagnoses:  Upper respiratory symptom  History of allergic rhinitis  Encounter for screening for COVID-19     Discharge Instructions      COVID/flu/RSV test is pending. If your COVID result is positive, you are a candidate to receive the antiviral, molnipiravir.   Take medication as directed. Increase fluids and get plenty of rest. May take over-the-counter ibuprofen or Tylenol as needed for pain, fever, or general discomfort. Recommend normal saline nasal spray to help with nasal congestion throughout the day. For your cough, continue use of your CPAP for humidified air. If your symptoms fail to improve within the next 7 to 10 days, please follow-up with your primary care physician.      ED Prescriptions     Medication Sig Dispense Auth. Provider   cetirizine (ZYRTEC) 10 MG tablet Take 1 tablet (10 mg total) by mouth daily. 30 tablet Mallie Giambra-Warren, Alda Lea, NP   benzonatate (TESSALON) 100 MG capsule Take 1 capsule (100 mg total) by mouth 3 (three) times daily as needed for cough. 30 capsule Aleeyah Bensen-Warren, Alda Lea, NP   fluticasone (FLONASE) 50 MCG/ACT nasal spray Place 2 sprays into both nostrils daily. 16 g Parvin Stetzer-Warren, Alda Lea, NP      PDMP not reviewed this encounter.   Tish Men, NP 09/03/22 1106

## 2022-09-03 NOTE — Discharge Instructions (Addendum)
COVID/flu/RSV test is pending. If your COVID result is positive, you are a candidate to receive the antiviral, molnipiravir.   Take medication as directed. Increase fluids and get plenty of rest. May take over-the-counter ibuprofen or Tylenol as needed for pain, fever, or general discomfort. Recommend normal saline nasal spray to help with nasal congestion throughout the day. For your cough, continue use of your CPAP for humidified air. If your symptoms fail to improve within the next 7 to 10 days, please follow-up with your primary care physician.

## 2022-09-05 ENCOUNTER — Telehealth (HOSPITAL_COMMUNITY): Payer: Self-pay | Admitting: Emergency Medicine

## 2022-09-05 MED ORDER — MOLNUPIRAVIR EUA 200MG CAPSULE: 4.0000 | ORAL_CAPSULE | Freq: Two times a day (BID) | ORAL | 0 refills | Status: AC

## 2022-09-05 MED ORDER — MOLNUPIRAVIR EUA 200MG CAPSULE: 4.0000 | ORAL_CAPSULE | Freq: Two times a day (BID) | ORAL | 0 refills | Status: DC

## 2022-09-05 NOTE — Telephone Encounter (Signed)
[facility] does not have Molnupiravir, resending to [facility], per patient's request

## 2022-09-13 DIAGNOSIS — I10 Essential (primary) hypertension: Secondary | ICD-10-CM | POA: Diagnosis not present

## 2022-09-20 DIAGNOSIS — M25775 Osteophyte, left foot: Secondary | ICD-10-CM | POA: Diagnosis not present

## 2022-09-20 DIAGNOSIS — M722 Plantar fascial fibromatosis: Secondary | ICD-10-CM | POA: Diagnosis not present

## 2022-09-20 DIAGNOSIS — M79671 Pain in right foot: Secondary | ICD-10-CM | POA: Diagnosis not present

## 2022-09-20 DIAGNOSIS — M79672 Pain in left foot: Secondary | ICD-10-CM | POA: Diagnosis not present

## 2022-09-20 DIAGNOSIS — M25774 Osteophyte, right foot: Secondary | ICD-10-CM | POA: Diagnosis not present

## 2022-10-25 DIAGNOSIS — M79672 Pain in left foot: Secondary | ICD-10-CM | POA: Diagnosis not present

## 2022-10-25 DIAGNOSIS — M25774 Osteophyte, right foot: Secondary | ICD-10-CM | POA: Diagnosis not present

## 2022-10-25 DIAGNOSIS — M25775 Osteophyte, left foot: Secondary | ICD-10-CM | POA: Diagnosis not present

## 2022-10-25 DIAGNOSIS — M79671 Pain in right foot: Secondary | ICD-10-CM | POA: Diagnosis not present

## 2022-11-16 ENCOUNTER — Encounter: Payer: Self-pay | Admitting: Cardiology

## 2022-11-16 ENCOUNTER — Ambulatory Visit: Payer: Medicare Other | Attending: Cardiology | Admitting: Cardiology

## 2022-11-16 VITALS — BP 136/64 | HR 56 | Ht 63.0 in | Wt 181.8 lb

## 2022-11-16 DIAGNOSIS — I4891 Unspecified atrial fibrillation: Secondary | ICD-10-CM | POA: Diagnosis not present

## 2022-11-16 DIAGNOSIS — I10 Essential (primary) hypertension: Secondary | ICD-10-CM

## 2022-11-16 NOTE — Patient Instructions (Signed)
Medication Instructions:  Continue all current medications.   Labwork: none  Testing/Procedures: none  Follow-Up: 6 months   Any Other Special Instructions Will Be Listed Below (If Applicable).   If you need a refill on your cardiac medications before your next appointment, please call your pharmacy.  

## 2022-11-16 NOTE — Progress Notes (Signed)
Clinical Summary Felicia Wright is a 72 y.o.female seen today for follow up of the following medical problems.    1.Afib/aflutter - diagnosis list in pcp note, details are unclear - apparently had been seeing Dr Cindie Laroche prior to current pcp, I do not have these records - had been on metoprolol but recently stopped due to low HRs - has not been on anticoagulation, unclear why not. She denies any recent bleeding issues.  - EKGs in epic reviewed, do not see afib. Do see aflutter in 08/12/2015.       09/2021 30 day monitor: no afib or aflutter - resting HRs low 50s, no symptoms  - no recent palpitations.       2. HTN -she is compliant with meds   3.Preoperative evaluation - possible right knee replacement - rides stationary bike x 40 minutes. Walks up to 60 minutes.    - 2 weeks ago rode stationary bike x 30 min - she is holding off on knee replacement Past Medical History:  Diagnosis Date   Abnormal heart rhythm    Accidental fall    Allergic rhinitis    Aphthous ulcer    Arthritis    Asthma    Bronchitis    Carpal tunnel syndrome    Cataract    Constipation    Contusion of second toe, right    Cystitis    Depression    Diabetes mellitus type II    diet controlled   Encounter for long-term (current) use of other medications    Fibroid uterus    GERD (gastroesophageal reflux disease)    Hip pain, left    Hyperlipidemia    Hypertension    IBS (irritable bowel syndrome)    Incontinence    Malaise and fatigue    Morbid obesity (HCC)    Normocytic anemia    Oral candidiasis    Overactive bladder    Preventative health care    Reactive depression (situational)    Seizure disorder (HCC)    Sleep apnea    Ulnar neuropathy    Left     Allergies  Allergen Reactions   Sertraline Hcl     Lips swelling & loose stools   Bee Venom    Peach [Prunus Persica]    Strawberry Extract      Current Outpatient Medications  Medication Sig Dispense Refill    acetaminophen (TYLENOL) 500 MG tablet Take 1,000 mg by mouth at bedtime as needed for pain.      amLODipine (NORVASC) 5 MG tablet Take 5 mg by mouth daily.     aspirin 81 MG tablet Take 81 mg by mouth at bedtime.     benzonatate (TESSALON) 100 MG capsule Take 1 capsule (100 mg total) by mouth 3 (three) times daily as needed for cough. 30 capsule 0   celecoxib (CELEBREX) 200 MG capsule Take 200 mg by mouth every morning.      cetirizine (ZYRTEC) 10 MG tablet Take 1 tablet (10 mg total) by mouth daily. 30 tablet 0   co-enzyme Q-10 30 MG capsule Take 30 mg by mouth 3 (three) times daily.     diphenhydrAMINE (BENADRYL) 25 mg capsule Take 50 mg by mouth at bedtime as needed for allergies or sleep.      fluticasone (FLONASE) 50 MCG/ACT nasal spray Place 2 sprays into both nostrils daily. 16 g 0   LINZESS 72 MCG capsule Take 72 mcg by mouth daily.     lisinopril (  ZESTRIL) 40 MG tablet Take 40 mg by mouth daily.     magnesium gluconate (MAGONATE) 500 MG tablet Take 500 mg by mouth daily.     Multiple Vitamin (MULTIVITAMIN WITH MINERALS) TABS Take 1 tablet by mouth daily.     omeprazole (PRILOSEC) 20 MG capsule Take 20 mg by mouth daily before breakfast.      Propylene Glycol-Glycerin (SOOTHE OP) Place 1 drop into the left eye daily.     simvastatin (ZOCOR) 40 MG tablet Take 40 mg by mouth at bedtime.     vitamin B-12 (CYANOCOBALAMIN) 500 MCG tablet Take 500 mcg by mouth daily.     No current facility-administered medications for this visit.     Past Surgical History:  Procedure Laterality Date   BREAST BIOPSY  03/07/2012   Procedure: BREAST BIOPSY WITH NEEDLE LOCALIZATION;  Surgeon: Harl Bowie, MD;  Location: [place];  Service: General;  Laterality: Right;  needle localized right breast lumpectomy   BREAST EXCISIONAL BIOPSY Right    right   CARPAL TUNNEL RELEASE     Right   CHOLECYSTECTOMY     COLONOSCOPY N/A 08/12/2015   Procedure: COLONOSCOPY;  Surgeon: Rogene Houston, MD;  [facility];  Service: Endoscopy;  Laterality: N/A;  [place] SPUR SURGERY     Right   HEMORROIDECTOMY     KNEE ARTHROSCOPY     Right   NASAL SINUS SURGERY  1983   TONSILLECTOMY     TOTAL SHOULDER ARTHROPLASTY Right 02/28/2013   Procedure: RIGHT TOTAL SHOULDER ARTHROPLASTY;  Surgeon: Marin Shutter, MD;  Location: [place];  Service: Orthopedics;  Laterality: Right;     Allergies  Allergen Reactions   Sertraline Hcl     Lips swelling & loose stools   Bee Venom    Peach [Prunus Persica]    Strawberry Extract       Family History  Problem Relation Age of Onset   Hypertension Mother    Heart failure Mother    Fibroids Sister    Hypertension Brother    Diabetes Brother    Arthritis Other    Coronary artery disease Other    Asthma Other      Social History Felicia Wright reports that she quit smoking about 23 years ago. Her smoking use included cigarettes. She has a 20.00 pack-year smoking history. She has never used smokeless tobacco. Felicia Wright reports no history of alcohol use.   Review of Systems CONSTITUTIONAL: No weight loss, fever, chills, weakness or fatigue.  HEENT: Eyes: No visual loss, blurred vision, double vision or yellow sclerae.No hearing loss, sneezing, congestion, runny nose or sore throat.  SKIN: No rash or itching.  CARDIOVASCULAR: per hpi RESPIRATORY: No shortness of breath, cough or sputum.  GASTROINTESTINAL: No anorexia, nausea, vomiting or diarrhea. No abdominal pain or blood.  GENITOURINARY: No burning on urination, no polyuria NEUROLOGICAL: No headache, dizziness, syncope, paralysis, ataxia, numbness or tingling in the extremities. No change in bowel or bladder control.  MUSCULOSKELETAL: No muscle, back pain, joint pain or stiffness.  LYMPHATICS: No enlarged nodes. No history of splenectomy.  PSYCHIATRIC: No history of depression or anxiety.  ENDOCRINOLOGIC: No reports of sweating, cold  or heat intolerance. No polyuria or polydipsia.  Marland Kitchen   Physical Examination Today's Vitals   11/16/22 1430  BP: 136/64  Pulse: (!) 56  SpO2: 98%  Weight: 181 lb 12.8 oz (82.5 kg)  Height: '5\' 3"'$  (1.6 m)   Body mass index is 32.2 kg/m.  Gen: resting comfortably, no acute distress HEENT: no scleral icterus, pupils equal round and reactive, no palptable cervical adenopathy,  CV: RRR, no mrg, no jvd Resp: Clear to auscultation bilaterally GI: abdomen is soft, non-tender, non-distended, normal bowel sounds, no hepatosplenomegaly MSK: extremities are warm, no edema.  Skin: warm, no rash Neuro:  no focal deficits Psych: appropriate affect   Diagnostic Studies  09/2021 30 day monitor 30 day monitor Min HR 38, Max HR 131, Avg HR 61 Telemetry tracings show sinus rhythm with PVCs. Short runs of NSVT up to 5 beats.   Assessment and Plan   1.Afib/Aflutter -don't have much information on this history other than a single EKG in 2016 showing aflutter - she had been on metoprolol but now off due to low HRs - it is not clear to me if discussions were ever had about anticoagulation, or if there was a reason it was not started in the past.  - recent 30 day monitor was benign. We have not seen evidence of recurrence - continue to monitor at this time, EKG shows sinus brady 56.      2. HTN -essentially at goal, continue current meds     3. Preoperative evaluation - considering knee replacement - tolerates greater than 4 METs without symptoms, from cardiac standpoint ok to proceed with surgery.     F/u 6 months Arnoldo Lenis, M.D.

## 2022-12-02 DIAGNOSIS — E559 Vitamin D deficiency, unspecified: Secondary | ICD-10-CM | POA: Diagnosis not present

## 2022-12-02 DIAGNOSIS — D649 Anemia, unspecified: Secondary | ICD-10-CM | POA: Diagnosis not present

## 2022-12-02 DIAGNOSIS — I10 Essential (primary) hypertension: Secondary | ICD-10-CM | POA: Diagnosis not present

## 2022-12-07 DIAGNOSIS — Z0001 Encounter for general adult medical examination with abnormal findings: Secondary | ICD-10-CM | POA: Diagnosis not present

## 2022-12-07 DIAGNOSIS — I4891 Unspecified atrial fibrillation: Secondary | ICD-10-CM | POA: Diagnosis not present

## 2022-12-07 DIAGNOSIS — M8580 Other specified disorders of bone density and structure, unspecified site: Secondary | ICD-10-CM | POA: Diagnosis not present

## 2022-12-07 DIAGNOSIS — E559 Vitamin D deficiency, unspecified: Secondary | ICD-10-CM | POA: Diagnosis not present

## 2022-12-07 DIAGNOSIS — D649 Anemia, unspecified: Secondary | ICD-10-CM | POA: Diagnosis not present

## 2022-12-07 DIAGNOSIS — M1990 Unspecified osteoarthritis, unspecified site: Secondary | ICD-10-CM | POA: Diagnosis not present

## 2022-12-07 DIAGNOSIS — E785 Hyperlipidemia, unspecified: Secondary | ICD-10-CM | POA: Diagnosis not present

## 2022-12-07 DIAGNOSIS — G4733 Obstructive sleep apnea (adult) (pediatric): Secondary | ICD-10-CM | POA: Diagnosis not present

## 2022-12-07 DIAGNOSIS — I10 Essential (primary) hypertension: Secondary | ICD-10-CM | POA: Diagnosis not present

## 2022-12-07 DIAGNOSIS — D696 Thrombocytopenia, unspecified: Secondary | ICD-10-CM | POA: Diagnosis not present

## 2022-12-07 DIAGNOSIS — M1711 Unilateral primary osteoarthritis, right knee: Secondary | ICD-10-CM | POA: Diagnosis not present

## 2023-05-16 DIAGNOSIS — H04123 Dry eye syndrome of bilateral lacrimal glands: Secondary | ICD-10-CM | POA: Diagnosis not present

## 2023-05-18 ENCOUNTER — Encounter: Payer: Self-pay | Admitting: Cardiology

## 2023-05-18 ENCOUNTER — Ambulatory Visit: Payer: Medicare Other | Attending: Cardiology | Admitting: Cardiology

## 2023-05-18 VITALS — BP 128/68 | HR 49 | Ht 63.0 in | Wt 182.0 lb

## 2023-05-18 DIAGNOSIS — I10 Essential (primary) hypertension: Secondary | ICD-10-CM

## 2023-05-18 DIAGNOSIS — I4891 Unspecified atrial fibrillation: Secondary | ICD-10-CM

## 2023-05-18 DIAGNOSIS — R001 Bradycardia, unspecified: Secondary | ICD-10-CM

## 2023-05-18 DIAGNOSIS — G4730 Sleep apnea, unspecified: Secondary | ICD-10-CM

## 2023-05-18 NOTE — Progress Notes (Signed)
Clinical Summary Felicia Wright is a 72 y.o.female seen today for follow up of the following medical problems.    1.Afib/aflutter - diagnosis list in pcp note, details are unclear - apparently had been seeing Dr Janna Arch prior to current pcp, I do not have these records - had been on metoprolol but recently stopped due to low HRs - has not been on anticoagulation, unclear why not. She denies any recent bleeding issues.  - EKGs in epic reviewed, do not see afib. Do see aflutter in 08/12/2015.       09/2021 30 day monitor: no afib or aflutter - resting HRs low 50s, no symptoms   - denies any palpitations - fitbit tells her resting HRs 50s, up to 124 with exercise.        2. HTN -she is compliant with meds   3.Preoperative evaluation - possible right knee replacement - rides stationary bike x 40 minutes. Walks up to 60 minutes.    - 2 weeks ago rode stationary bike x 30 min - she is holding off on knee replacement   4.OSA - needs to reestablish with sleep medicine   Past Medical History:  Diagnosis Date   Abnormal heart rhythm    Accidental fall    Allergic rhinitis    Aphthous ulcer    Arthritis    Asthma    Bronchitis    Carpal tunnel syndrome    Cataract    Constipation    Contusion of second toe, right    Cystitis    Depression    Diabetes mellitus type II    diet controlled   Encounter for long-term (current) use of other medications    Fibroid uterus    GERD (gastroesophageal reflux disease)    Hip pain, left    Hyperlipidemia    Hypertension    IBS (irritable bowel syndrome)    Incontinence    Malaise and fatigue    Morbid obesity (HCC)    Normocytic anemia    Oral candidiasis    Overactive bladder    Preventative health care    Reactive depression (situational)    Seizure disorder (HCC)    Sleep apnea    Ulnar neuropathy    Left     Allergies  Allergen Reactions   Sertraline Hcl     Lips swelling & loose stools   Bee Venom     Peach [Prunus Persica]    Strawberry Extract      Current Outpatient Medications  Medication Sig Dispense Refill   acetaminophen (TYLENOL) 500 MG tablet Take 1,000 mg by mouth at bedtime as needed for pain.      amLODipine (NORVASC) 5 MG tablet Take 5 mg by mouth daily.     aspirin 81 MG tablet Take 81 mg by mouth at bedtime.     benzonatate (TESSALON) 100 MG capsule Take 1 capsule (100 mg total) by mouth 3 (three) times daily as needed for cough. 30 capsule 0   celecoxib (CELEBREX) 200 MG capsule Take 200 mg by mouth every morning.      cetirizine (ZYRTEC) 10 MG tablet Take 1 tablet (10 mg total) by mouth daily. 30 tablet 0   co-enzyme Q-10 30 MG capsule Take 30 mg by mouth 3 (three) times daily.     diphenhydrAMINE (BENADRYL) 25 mg capsule Take 50 mg by mouth at bedtime as needed for allergies or sleep.      fluticasone (FLONASE) 50 MCG/ACT nasal spray Place  2 sprays into both nostrils daily. 16 g 0   LINZESS 72 MCG capsule Take 72 mcg by mouth daily.     lisinopril (ZESTRIL) 40 MG tablet Take 40 mg by mouth daily.     magnesium gluconate (MAGONATE) 500 MG tablet Take 500 mg by mouth daily.     Multiple Vitamin (MULTIVITAMIN WITH MINERALS) TABS Take 1 tablet by mouth daily.     omeprazole (PRILOSEC) 20 MG capsule Take 20 mg by mouth daily before breakfast.      Propylene Glycol-Glycerin (SOOTHE OP) Place 1 drop into the left eye daily.     simvastatin (ZOCOR) 40 MG tablet Take 40 mg by mouth at bedtime.     No current facility-administered medications for this visit.     Past Surgical History:  Procedure Laterality Date   BREAST BIOPSY  03/07/2012   Procedure: BREAST BIOPSY WITH NEEDLE LOCALIZATION;  Surgeon: Shelly Rubenstein, MD;  Location: [facility];  Service: General;  Laterality: Right;  needle localized right breast lumpectomy   BREAST EXCISIONAL BIOPSY Right    right   CARPAL TUNNEL RELEASE     Right   CHOLECYSTECTOMY     COLONOSCOPY N/A 08/12/2015    Procedure: COLONOSCOPY;  Surgeon: Malissa Hippo, MD;  Location: [facility];  Service: Endoscopy;  Laterality: N/A;  955   DILATION AND CURETTAGE OF UTERUS     HEEL SPUR SURGERY     Right   HEMORROIDECTOMY     KNEE ARTHROSCOPY     Right   NASAL SINUS SURGERY  1983   TONSILLECTOMY     TOTAL SHOULDER ARTHROPLASTY Right 02/28/2013   Procedure: RIGHT TOTAL SHOULDER ARTHROPLASTY;  Surgeon: Senaida Lange, MD;  Location: [facility];  Service: Orthopedics;  Laterality: Right;     Allergies  Allergen Reactions   Sertraline Hcl     Lips swelling & loose stools   Bee Venom    Peach [Prunus Persica]    Strawberry Extract       Family History  Problem Relation Age of Onset   Hypertension Mother    Heart failure Mother    Fibroids Sister    Hypertension Brother    Diabetes Brother    Arthritis Other    Coronary artery disease Other    Asthma Other      Social History Ms. Methvin reports that she quit smoking about 24 years ago. Her smoking use included cigarettes. She started smoking about 34 years ago. She has a 20 pack-year smoking history. She has never used smokeless tobacco. Ms. Capers reports no history of alcohol use.   Review of Systems CONSTITUTIONAL: No weight loss, fever, chills, weakness or fatigue.  HEENT: Eyes: No visual loss, blurred vision, double vision or yellow sclerae.No hearing loss, sneezing, congestion, runny nose or sore throat.  SKIN: No rash or itching.  CARDIOVASCULAR: per hpi RESPIRATORY: No shortness of breath, cough or sputum.  GASTROINTESTINAL: No anorexia, nausea, vomiting or diarrhea. No abdominal pain or blood.  GENITOURINARY: No burning on urination, no polyuria NEUROLOGICAL: No headache, dizziness, syncope, paralysis, ataxia, numbness or tingling in the extremities. No change in bowel or bladder control.  MUSCULOSKELETAL: No muscle, back pain, joint pain or stiffness.  LYMPHATICS: No enlarged nodes. No history of splenectomy.  PSYCHIATRIC: No  history of depression or anxiety.  ENDOCRINOLOGIC: No reports of sweating, cold or heat intolerance. No polyuria or polydipsia.  Marland Kitchen   Physical Examination Today's Vitals   05/18/23 1421  BP: 128/68  Pulse: (!) 49  SpO2: 98%  Weight: 182 lb (82.6 kg)  Height: 5\' 3"  (1.6 m)   Body mass index is 32.24 kg/m.  Gen: resting comfortably, no acute distress HEENT: no scleral icterus, pupils equal round and reactive, no palptable cervical adenopathy,  CV: brady, regular, no m/r/g no jvd Resp: Clear to auscultation bilaterally GI: abdomen is soft, non-tender, non-distended, normal bowel sounds, no hepatosplenomegaly MSK: extremities are warm, no edema.  Skin: warm, no rash Neuro:  no focal deficits Psych: appropriate affect   Diagnostic Studies  09/2021 30 day monitor 30 day monitor Min HR 38, Max HR 131, Avg HR 61 Telemetry tracings show sinus rhythm with PVCs. Short runs of NSVT up to 5 beats.   Assessment and Plan  1.Afib/Aflutter -don't have much information on this history other than a single EKG in 2016 showing aflutter - she had been on metoprolol but now off due to low HRs - it is not clear to me if discussions were ever had about anticoagulation, or if there was a reason it was not started in the past.  - recent 30 day monitor was benign. Routine EKGs at visits have not show recurrence - continue to monitor at this time.      2. HTN -she is at goal, continue current meds     3.Bradycardia - chronic resting bradycardia, no significant symptoms and appropriate chronotropic response to activity - continue to moniotor    F/u 6 months   Antoine Poche, M.D.

## 2023-05-18 NOTE — Patient Instructions (Signed)
Medication Instructions:  Your physician recommends that you continue on your current medications as directed. Please refer to the Current Medication list given to you today.  *If you need a refill on your cardiac medications before your next appointment, please call your pharmacy*   Lab Work: None If you have labs (blood work) drawn today and your tests are completely normal, you will receive your results only by: MyChart Message (if you have MyChart) OR A paper copy in the mail If you have any lab test that is abnormal or we need to change your treatment, we will call you to review the results.   Testing/Procedures: None   Follow-Up: At [facility], you and your health needs are our priority.  As part of our continuing mission to provide you with exceptional heart care, we have created designated Provider Care Teams.  These Care Teams include your primary Cardiologist (physician) and Advanced Practice Providers (APPs -  Physician Assistants and Nurse Practitioners) who all work together to provide you with the care you need, when you need it.  We recommend signing up for the patient portal called "MyChart".  Sign up information is provided on this After Visit Summary.  MyChart is used to connect with patients for Virtual Visits (Telemedicine).  Patients are able to view lab/test results, encounter notes, upcoming appointments, etc.  Non-urgent messages can be sent to your provider as well.   To learn more about what you can do with MyChart, go to [email or web address].    Your next appointment:   6 month(s)  Provider:   Dina Rich, MD    Other Instructions You have been referred to Pulmonology. They will call you with your first appointment.

## 2023-06-01 DIAGNOSIS — I10 Essential (primary) hypertension: Secondary | ICD-10-CM | POA: Diagnosis not present

## 2023-06-01 DIAGNOSIS — E559 Vitamin D deficiency, unspecified: Secondary | ICD-10-CM | POA: Diagnosis not present

## 2023-06-07 DIAGNOSIS — I4891 Unspecified atrial fibrillation: Secondary | ICD-10-CM | POA: Diagnosis not present

## 2023-06-07 DIAGNOSIS — M8580 Other specified disorders of bone density and structure, unspecified site: Secondary | ICD-10-CM | POA: Diagnosis not present

## 2023-06-07 DIAGNOSIS — E559 Vitamin D deficiency, unspecified: Secondary | ICD-10-CM | POA: Diagnosis not present

## 2023-06-07 DIAGNOSIS — K5900 Constipation, unspecified: Secondary | ICD-10-CM | POA: Diagnosis not present

## 2023-06-07 DIAGNOSIS — D649 Anemia, unspecified: Secondary | ICD-10-CM | POA: Diagnosis not present

## 2023-06-07 DIAGNOSIS — G4733 Obstructive sleep apnea (adult) (pediatric): Secondary | ICD-10-CM | POA: Diagnosis not present

## 2023-06-07 DIAGNOSIS — I10 Essential (primary) hypertension: Secondary | ICD-10-CM | POA: Diagnosis not present

## 2023-06-07 DIAGNOSIS — M1711 Unilateral primary osteoarthritis, right knee: Secondary | ICD-10-CM | POA: Diagnosis not present

## 2023-06-07 DIAGNOSIS — D696 Thrombocytopenia, unspecified: Secondary | ICD-10-CM | POA: Diagnosis not present

## 2023-06-07 DIAGNOSIS — E785 Hyperlipidemia, unspecified: Secondary | ICD-10-CM | POA: Diagnosis not present

## 2023-06-07 DIAGNOSIS — M1990 Unspecified osteoarthritis, unspecified site: Secondary | ICD-10-CM | POA: Diagnosis not present

## 2023-06-21 ENCOUNTER — Other Ambulatory Visit: Payer: Self-pay | Admitting: Internal Medicine

## 2023-06-21 DIAGNOSIS — Z1231 Encounter for screening mammogram for malignant neoplasm of breast: Secondary | ICD-10-CM

## 2023-08-01 ENCOUNTER — Ambulatory Visit: Payer: Medicare Other

## 2023-08-03 ENCOUNTER — Ambulatory Visit
Admission: RE | Admit: 2023-08-03 | Discharge: 2023-08-03 | Disposition: A | Discharge status: Home / Self Care | Payer: Medicare Other | Source: Ambulatory Visit | Attending: Internal Medicine | Admitting: Internal Medicine

## 2023-08-03 DIAGNOSIS — Z1231 Encounter for screening mammogram for malignant neoplasm of breast: Secondary | ICD-10-CM | POA: Diagnosis not present

## 2023-08-11 ENCOUNTER — Ambulatory Visit: Payer: Medicare Other | Admitting: Primary Care

## 2023-08-11 ENCOUNTER — Encounter: Payer: Self-pay | Admitting: Primary Care

## 2023-08-11 VITALS — BP 134/59 | HR 60 | Ht 63.0 in | Wt 190.6 lb

## 2023-08-11 DIAGNOSIS — G4730 Sleep apnea, unspecified: Secondary | ICD-10-CM | POA: Diagnosis not present

## 2023-08-11 DIAGNOSIS — I10 Essential (primary) hypertension: Secondary | ICD-10-CM

## 2023-08-11 NOTE — Patient Instructions (Addendum)
Download showed current pressure settings you are still having some mild residual apneas, average 3.8/hour  Check check with PCP if you can take Amlodipine to morning (could be causing frequent urination) / monitor BP before and about 1 hour after taking medication   Recommend increasing pressure settings 15cm h20  If not better at follow-up we will check repeat sleep study/titration study in sleep lab   Follow-up 4 week follow-up / Televisit Friday afternoon with Waynetta Sandy NP ok (please get email and cell number)

## 2023-08-11 NOTE — Progress Notes (Addendum)
@Patient  ID: Felicia Wright, female    DOB: August 13, 1951, 72 y.o.   MRN: [id number]  Chief Complaint  Patient presents with   Consult    Referring provider: Benita Stabile, MD  HPI: 72 year old female, former smoker quit in 2000.  Past medical history significant for hypertension, asthma, allergic rhinitis, OSA, type 2 diabetes, hyperlipidemia, obesity.  08/11/2023 Patient presents today for sleep consult. Referred by Dr. Wyline Mood. Long standing hx sleep apnea. When she was first diagnosed with OSA she weight 450lbs. Current weight 190lbs. She is on her third CPAP machine, feels she needs pressure settings adjusted. Current pressure 13cm h20. Sleep has been more disrupted the last 5 years. She takes benadryl nightly. No trouble falling asleep. She sleeps on her side but will wake up on her back. She wakes up 2 hours use the restroom. She limits her fluids after 6pm. She reports 100% compliance with use. Typical bed time is 9-10pm. It takes her 30 mins to fall asleep. Wakes up 5-6 times a night. She starts her day at 8am. DME [employer].   Sleep questionnaire Symptoms-I will stop breathing and sleep, I stop breathing when I had a sleep study Bedtime-9 PM to 10 PM Time to fall asleep-30 minutes Nocturnal awakenings-5-6 times Start of day-8 AM You operate heavy machinery-other than driving, no Weight changes over the past 2 years-down to 100 pounds since original sleep study Previous sleep study-yes, 15 years ago Do you currently wear CPAP-yes, pressure settings 13 cm H2O Nocturnal oxygen-no Epworth-2  Airview download 07/12/23-08/10/23 Usage 30/30 days (100%) > 4hours Average usage 8 hours 45 mins Pressure 13cm h20 Airleaks 13.8L/min AHI 3.8  Allergies  Allergen Reactions   Sertraline Hcl     Lips swelling & loose stools   Bee Venom    Peach [Prunus Persica]    Strawberry Extract     Immunization History  Administered Date(s) Administered   H1N1 11/12/2008   Influenza  Whole 08/14/2006, 09/11/2007   Pneumococcal Polysaccharide-23 08/14/2006    Past Medical History:  Diagnosis Date   Abnormal heart rhythm    Accidental fall    Allergic rhinitis    Aphthous ulcer    Arthritis    Asthma    Bronchitis    Carpal tunnel syndrome    Cataract    Constipation    Contusion of second toe, right    Cystitis    Depression    Diabetes mellitus type II    diet controlled   Encounter for long-term (current) use of other medications    Fibroid uterus    GERD (gastroesophageal reflux disease)    Hip pain, left    Hyperlipidemia    Hypertension    IBS (irritable bowel syndrome)    Incontinence    Malaise and fatigue    Morbid obesity (HCC)    Normocytic anemia    Oral candidiasis    Overactive bladder    Preventative health care    Reactive depression (situational)    Seizure disorder (HCC)    Sleep apnea    Ulnar neuropathy    Left    Tobacco History: Social History   Tobacco Use  Smoking Status Former   Current packs/day: 0.00   Average packs/day: 2.0 packs/day for 10.0 years (20.0 ttl pk-yrs)   Types: Cigarettes   Start date: 03/02/1989   Quit date: 03/03/1999   Years since quitting: 24.5  Smokeless Tobacco Never   Counseling given: Not Answered   Outpatient Medications Prior to  Visit  Medication Sig Dispense Refill   acetaminophen (TYLENOL) 500 MG tablet Take 1,000 mg by mouth at bedtime as needed for pain.      amLODipine (NORVASC) 5 MG tablet Take 5 mg by mouth daily.     aspirin 81 MG tablet Take 81 mg by mouth at bedtime.     celecoxib (CELEBREX) 200 MG capsule Take 200 mg by mouth every morning.      cetirizine (ZYRTEC) 10 MG tablet Take 1 tablet (10 mg total) by mouth daily. 30 tablet 0   co-enzyme Q-10 30 MG capsule Take 30 mg by mouth 3 (three) times daily.     fluticasone (FLONASE) 50 MCG/ACT nasal spray Place 2 sprays into both nostrils daily. 16 g 0   LINZESS 72 MCG capsule Take 72 mcg by mouth daily.     lisinopril  (ZESTRIL) 40 MG tablet Take 40 mg by mouth daily.     magnesium gluconate (MAGONATE) 500 MG tablet Take 500 mg by mouth daily.     Misc Natural Products (BEET ROOT PO) Take by mouth.     Multiple Vitamin (MULTIVITAMIN WITH MINERALS) TABS Take 1 tablet by mouth daily.     omeprazole (PRILOSEC) 20 MG capsule Take 20 mg by mouth daily before breakfast.      Propylene Glycol-Glycerin (SOOTHE OP) Place 1 drop into the left eye daily.     simvastatin (ZOCOR) 40 MG tablet Take 40 mg by mouth at bedtime.     benzonatate (TESSALON) 100 MG capsule Take 1 capsule (100 mg total) by mouth 3 (three) times daily as needed for cough. (Patient not taking: Reported on 08/11/2023) 30 capsule 0   diphenhydrAMINE (BENADRYL) 25 mg capsule Take 50 mg by mouth at bedtime as needed for allergies or sleep.  (Patient not taking: Reported on 08/11/2023)     No facility-administered medications prior to visit.   Review of Systems  Review of Systems  Constitutional: Negative.   Respiratory: Negative.    Psychiatric/Behavioral:  Positive for sleep disturbance.    Physical Exam  BP (!) 134/59   Pulse 60   Ht 5\' 3"  (1.6 m)   Wt 190 lb 9.6 oz (86.5 kg)   BMI 33.76 kg/m  Physical Exam Constitutional:      Appearance: Normal appearance.  HENT:     Head: Normocephalic and atraumatic.     Mouth/Throat:     Mouth: Mucous membranes are moist.     Pharynx: Oropharynx is clear.  Cardiovascular:     Rate and Rhythm: Normal rate and regular rhythm.  Pulmonary:     Effort: Pulmonary effort is normal.     Breath sounds: Normal breath sounds.  Skin:    General: Skin is warm and dry.  Neurological:     General: No focal deficit present.     Mental Status: She is alert and oriented to person, place, and time. Mental status is at baseline.  Psychiatric:        Mood and Affect: Mood normal.        Behavior: Behavior normal.        Thought Content: Thought content normal.        Judgment: Judgment normal.      Lab  Results:  CBC    Component Value Date/Time   WBC 5.5 02/22/2013 1145   RBC 4.18 02/22/2013 1145   HGB 12.4 02/22/2013 1145   HCT 36.4 02/22/2013 1145   PLT 124 (L) 02/22/2013 1145   MCV 87.1  02/22/2013 1145   MCH 29.7 02/22/2013 1145   MCHC 34.1 02/22/2013 1145   RDW 14.1 02/22/2013 1145   LYMPHSABS 1.6 08/25/2010 1244   MONOABS 0.4 08/25/2010 1244   EOSABS 0.2 08/25/2010 1244   BASOSABS 0.0 08/25/2010 1244    BMET    Component Value Date/Time   NA 132 (L) 02/22/2013 1145   K 4.2 02/22/2013 1145   CL 98 02/22/2013 1145   CO2 26 02/22/2013 1145   GLUCOSE 86 02/22/2013 1145   BUN 20 02/22/2013 1145   CREATININE 0.85 02/22/2013 1145   CALCIUM 10.0 02/22/2013 1145   GFRNONAA 71 (L) 02/22/2013 1145   GFRAA 83 (L) 02/22/2013 1145    BNP No results found for: "BNP"  ProBNP No results found for: "PROBNP"  Imaging: MM 3D SCREENING MAMMOGRAM BILATERAL BREAST  Result Date: 08/07/2023 CLINICAL DATA:  Screening. EXAM: DIGITAL SCREENING BILATERAL MAMMOGRAM WITH TOMOSYNTHESIS AND CAD TECHNIQUE: Bilateral screening digital craniocaudal and mediolateral oblique mammograms were obtained. Bilateral screening digital breast tomosynthesis was performed. The images were evaluated with computer-aided detection. COMPARISON:  Previous exam(s). ACR Breast Density Category b: There are scattered areas of fibroglandular density. FINDINGS: There are no findings suspicious for malignancy. IMPRESSION: No mammographic evidence of malignancy. A result letter of this screening mammogram will be mailed directly to the patient. RECOMMENDATION: Screening mammogram in one year. (Code:SM-B-01Y) BI-RADS CATEGORY  1: Negative. Electronically Signed   By: Baird Lyons M.D.   On: 08/07/2023 10:47     Assessment & Plan:   Sleep apnea Patient is still having some mild residual apneas at current pressure settings Recommend increasing pressure settings 15cm h20 If not better at follow-up we will check repeat  sleep study/titration study in sleep lab   Follow-up 4 week follow-up / Televisit Friday afternoon with Waynetta Sandy NP ok (please get email and cell number)  Essential hypertension Check with PCP if you can take Amlodipine to morning (could be causing frequent urination) / monitor BP before and about 1 hour after taking medication   Glenford Bayley, NP 08/28/2023

## 2023-08-18 ENCOUNTER — Telehealth: Payer: Self-pay | Admitting: Primary Care

## 2023-08-18 NOTE — Telephone Encounter (Signed)
[facility] calling stating [facility] is no longer in network and this pt is req CPAP supplies.  [facility] said we should call [facility] @ [phone] and they will give [place] a fax number where we can send in the order.   Please call member back once we have placed the order.

## 2023-08-18 NOTE — Telephone Encounter (Signed)
The order hasn't been sent to [facility] yet because I was waiting for completed office note which will be needed

## 2023-08-28 DIAGNOSIS — G4730 Sleep apnea, unspecified: Secondary | ICD-10-CM | POA: Insufficient documentation

## 2023-08-28 NOTE — Assessment & Plan Note (Signed)
Patient is still having some mild residual apneas at current pressure settings Recommend increasing pressure settings 15cm h20 If not better at follow-up we will check repeat sleep study/titration study in sleep lab   Follow-up 4 week follow-up / Televisit Friday afternoon with Waynetta Sandy NP ok (please get email and cell number)

## 2023-08-28 NOTE — Addendum Note (Signed)
Addended by: Glenford Bayley on: 08/28/2023 08:48 AM   Modules accepted: Level of Service

## 2023-08-28 NOTE — Assessment & Plan Note (Signed)
Check with PCP if you can take Amlodipine to morning (could be causing frequent urination) / monitor BP before and about 1 hour after taking medication

## 2023-08-28 NOTE — Telephone Encounter (Signed)
I have faxed the referral, notes, sleep study to [facility]

## 2023-09-07 ENCOUNTER — Telehealth: Payer: Self-pay | Admitting: Primary Care

## 2023-09-07 NOTE — Telephone Encounter (Signed)
Patient states CPAP machine needs pressure settings increased. Also needs CPAP supplies. States Felicia Wright has not contacted her. Patient phone number is [phone].

## 2023-09-11 NOTE — Telephone Encounter (Signed)
Called pt no answer, left detailed message as listed in dpr. The order has been sent to [facility] and received confirmation on 11/1.

## 2023-09-12 ENCOUNTER — Telehealth: Payer: Self-pay | Admitting: Primary Care

## 2023-09-12 NOTE — Telephone Encounter (Addendum)
Per chart order was sent to [facility].MYDME [id number])  Spoke with 5 different reps who were not sure what was going on with the order we sent them. Finally someone advised they needed the patient's machine and mask info before they could process the order. Spoke with [employer] and they advised machine is ResMed AirSense 10, mask is QuatroAir FF Medium. Spoke again with [facility], they will process order for supplies and state patient should receive in 48-72 hours. They also state they will work on adjusting pressure settings.   Spoke with patient and advised. Recommended she contact office Fri/Mon if she has not received supplies and/or pressure has not been changed. She voiced understanding. Nothing further needed at this time.

## 2023-09-12 NOTE — Telephone Encounter (Signed)
Patient is wondering here her cpap supplies were delivered to. If they are in office please call. Also, patient want to know how her setting on her machine will be adjusted. Settings need to be changed from 13 to 15/20.

## 2023-09-15 ENCOUNTER — Telehealth: Payer: Medicare Other | Admitting: Primary Care

## 2023-09-19 NOTE — Telephone Encounter (Signed)
Patient is calling to let us know she has not received her CPAP supplies and has not heard from the company either.  Please advise.  [phone]

## 2023-09-20 NOTE — Telephone Encounter (Signed)
Spoke with [facility] and their notes show they have been attempting to reach the patient multiple times since 09/15/23 to confirm shipping address and payment info, with no response. They have now closed the order. They were calling [phone]. They have been provided patient's preferred number of [phone] for future use. They have reopened the order and will contact patient today at the preferred number.  ATC patient to advise, LVM to call us.   Please advise patient of above when she returns call.

## 2023-09-28 ENCOUNTER — Telehealth: Payer: Self-pay | Admitting: Primary Care

## 2023-09-28 DIAGNOSIS — G4730 Sleep apnea, unspecified: Secondary | ICD-10-CM

## 2023-09-28 NOTE — Telephone Encounter (Signed)
Pt states Ms. Felicia Wright was going to change her CPAP setting from 13 to 15 CMH20  Please send RX to [place] so they can change it remotely. Toniann Fail in the Respt Dept. If you have question.   She has been trying to get this changed since Oct 4th. PT getting frustrated. TY.

## 2023-09-29 NOTE — Telephone Encounter (Signed)
I put the order in on October 4th to change CPAP pressure to 15 cm h20, I double checked it is in there on my end. I can re-enter as urgent, below is the order I Sent.    Change CPAP pressure 15cm h20 Renew CPAP supplies, please provide patient with new full face mask and tubing DME [employer]

## 2023-10-02 NOTE — Telephone Encounter (Signed)
Hey Chantel,  can you follow up on this please?  I see the order as well.  Thank you!

## 2023-10-02 NOTE — Telephone Encounter (Signed)
The order was sent on Friday to them

## 2023-11-20 ENCOUNTER — Encounter: Payer: Self-pay | Admitting: Cardiology

## 2023-11-20 ENCOUNTER — Ambulatory Visit: Payer: PPO | Attending: Cardiology | Admitting: Cardiology

## 2023-11-20 VITALS — BP 138/62 | HR 53 | Ht 63.0 in | Wt 197.0 lb

## 2023-11-20 DIAGNOSIS — R001 Bradycardia, unspecified: Secondary | ICD-10-CM | POA: Diagnosis not present

## 2023-11-20 DIAGNOSIS — E782 Mixed hyperlipidemia: Secondary | ICD-10-CM | POA: Diagnosis not present

## 2023-11-20 DIAGNOSIS — I10 Essential (primary) hypertension: Secondary | ICD-10-CM | POA: Diagnosis not present

## 2023-11-20 DIAGNOSIS — I4891 Unspecified atrial fibrillation: Secondary | ICD-10-CM

## 2023-11-20 NOTE — Patient Instructions (Signed)
 Medication Instructions:  Your physician recommends that you continue on your current medications as directed. Please refer to the Current Medication list given to you today.  *If you need a refill on your cardiac medications before your next appointment, please call your pharmacy*   Lab Work: None If you have labs (blood work) drawn today and your tests are completely normal, you will receive your results only by: MyChart Message (if you have MyChart) OR A paper copy in the mail If you have any lab test that is abnormal or we need to change your treatment, we will call you to review the results.   Testing/Procedures: None   Follow-Up: At [facility], you and your health needs are our priority.  As part of our continuing mission to provide you with exceptional heart care, we have created designated Provider Care Teams.  These Care Teams include your primary Cardiologist (physician) and Advanced Practice Providers (APPs -  Physician Assistants and Nurse Practitioners) who all work together to provide you with the care you need, when you need it.  We recommend signing up for the patient portal called "MyChart".  Sign up information is provided on this After Visit Summary.  MyChart is used to connect with patients for Virtual Visits (Telemedicine).  Patients are able to view lab/test results, encounter notes, upcoming appointments, etc.  Non-urgent messages can be sent to your provider as well.   To learn more about what you can do with MyChart, go to [email or web address].    Your next appointment:   6 month(s)  Provider:   You may see Dina Rich, MD or one of the following Advanced Practice Providers on your designated Care Team:   Randall An, PA-C  Jacolyn Reedy, [place]     Other Instructions

## 2023-11-20 NOTE — Progress Notes (Signed)
 Clinical Summary Ms. Trimarco is a 73 y.o.female seen today for follow up of the following medical problems.    1.Afib/aflutter - diagnosis list in pcp note, details are unclear - apparently had been seeing Dr Rennis prior to current pcp, I do not have these records - had been on metoprolol  but recently stopped due to low HRs - has not been on anticoagulation, unclear why not. She denies any recent bleeding issues.  - EKGs in epic reviewed, do not see afib. Do see aflutter in 08/12/2015.       09/2021 30 day monitor: no afib or aflutter - resting HRs low 50s, no symptoms  - no recent palpitations - compliant with meds   2. HTN -she is compliant with meds - home bp's 120s-130s/ 60s. HRs 50s primarily, rarely high 40s.     3.OSA - followed by pulmonary  4. HLD - 05/2023 TC 203 TG 57 HDL 126 LDL 66 - compliant with meds   Past Medical History:  Diagnosis Date   Abnormal heart rhythm    Accidental fall    Allergic rhinitis    Aphthous ulcer    Arthritis    Asthma    Bronchitis    Carpal tunnel syndrome    Cataract    Constipation    Contusion of second toe, right    Cystitis    Depression    Diabetes mellitus type II    diet controlled   Encounter for long-term (current) use of other medications    Fibroid uterus    GERD (gastroesophageal reflux disease)    Hip pain, left    Hyperlipidemia    Hypertension    IBS (irritable bowel syndrome)    Incontinence    Malaise and fatigue    Morbid obesity (HCC)    Normocytic anemia    Oral candidiasis    Overactive bladder    Preventative health care    Reactive depression (situational)    Seizure disorder (HCC)    Sleep apnea    Ulnar neuropathy    Left     Allergies  Allergen Reactions   Sertraline Hcl     Lips swelling & loose stools   Bee Venom    Peach [Prunus Persica]    Strawberry Extract      Current Outpatient Medications  Medication Sig Dispense Refill   acetaminophen  (TYLENOL ) 500 MG  tablet Take 1,000 mg by mouth at bedtime as needed for pain.      amLODipine (NORVASC) 5 MG tablet Take 5 mg by mouth daily.     aspirin 81 MG tablet Take 81 mg by mouth at bedtime.     benzonatate  (TESSALON ) 100 MG capsule Take 1 capsule (100 mg total) by mouth 3 (three) times daily as needed for cough. (Patient not taking: Reported on 08/11/2023) 30 capsule 0   celecoxib (CELEBREX) 200 MG capsule Take 200 mg by mouth every morning.      cetirizine  (ZYRTEC ) 10 MG tablet Take 1 tablet (10 mg total) by mouth daily. 30 tablet 0   co-enzyme Q-10 30 MG capsule Take 30 mg by mouth 3 (three) times daily.     diphenhydrAMINE  (BENADRYL ) 25 mg capsule Take 50 mg by mouth at bedtime as needed for allergies or sleep.  (Patient not taking: Reported on 08/11/2023)     fluticasone  (FLONASE ) 50 MCG/ACT nasal spray Place 2 sprays into both nostrils daily. 16 g 0   LINZESS 72 MCG capsule Take 72  mcg by mouth daily.     lisinopril  (ZESTRIL ) 40 MG tablet Take 40 mg by mouth daily.     magnesium gluconate (MAGONATE) 500 MG tablet Take 500 mg by mouth daily.     Misc Natural Products (BEET ROOT PO) Take by mouth.     Multiple Vitamin (MULTIVITAMIN WITH MINERALS) TABS Take 1 tablet by mouth daily.     omeprazole (PRILOSEC) 20 MG capsule Take 20 mg by mouth daily before breakfast.      Propylene Glycol-Glycerin (SOOTHE OP) Place 1 drop into the left eye daily.     simvastatin  (ZOCOR ) 40 MG tablet Take 40 mg by mouth at bedtime.     No current facility-administered medications for this visit.     Past Surgical History:  Procedure Laterality Date   BREAST BIOPSY  03/07/2012   Procedure: BREAST BIOPSY WITH NEEDLE LOCALIZATION;  Surgeon: Vicenta DELENA Poli, MD;  Location: [facility];  Service: General;  Laterality: Right;  needle localized right breast lumpectomy   BREAST EXCISIONAL BIOPSY Right    right   CARPAL TUNNEL RELEASE     Right   CHOLECYSTECTOMY     COLONOSCOPY N/A 08/12/2015   Procedure:  COLONOSCOPY;  Surgeon: Claudis RAYMOND Rivet, MD;  Location: [facility];  Service: Endoscopy;  Laterality: N/A;  955   DILATION AND CURETTAGE OF UTERUS     HEEL SPUR SURGERY     Right   HEMORROIDECTOMY     KNEE ARTHROSCOPY     Right   NASAL SINUS SURGERY  1983   TONSILLECTOMY     TOTAL SHOULDER ARTHROPLASTY Right 02/28/2013   Procedure: RIGHT TOTAL SHOULDER ARTHROPLASTY;  Surgeon: Franky CHRISTELLA Pointer, MD;  Location: [facility];  Service: Orthopedics;  Laterality: Right;     Allergies  Allergen Reactions   Sertraline Hcl     Lips swelling & loose stools   Bee Venom    Peach [Prunus Persica]    Strawberry Extract       Family History  Problem Relation Age of Onset   Hypertension Mother    Heart failure Mother    Fibroids Sister    Hypertension Brother    Diabetes Brother    Arthritis Other    Coronary artery disease Other    Asthma Other      Social History Ms. Holderman reports that she quit smoking about 24 years ago. Her smoking use included cigarettes. She started smoking about 34 years ago. She has a 20 pack-year smoking history. She has never used smokeless tobacco. Ms. Lampton reports no history of alcohol use.   Review of Systems CONSTITUTIONAL: No weight loss, fever, chills, weakness or fatigue.  HEENT: Eyes: No visual loss, blurred vision, double vision or yellow sclerae.No hearing loss, sneezing, congestion, runny nose or sore throat.  SKIN: No rash or itching.  CARDIOVASCULAR: per hpi RESPIRATORY: No shortness of breath, cough or sputum.  GASTROINTESTINAL: No anorexia, nausea, vomiting or diarrhea. No abdominal pain or blood.  GENITOURINARY: No burning on urination, no polyuria NEUROLOGICAL: No headache, dizziness, syncope, paralysis, ataxia, numbness or tingling in the extremities. No change in bowel or bladder control.  MUSCULOSKELETAL: No muscle, back pain, joint pain or stiffness.  LYMPHATICS: No enlarged nodes. No history of splenectomy.  PSYCHIATRIC: No history of  depression or anxiety.  ENDOCRINOLOGIC: No reports of sweating, cold or heat intolerance. No polyuria or polydipsia.  SABRA   Physical Examination Today's Vitals   11/20/23 1400  BP: 138/62  Pulse: ROLLEN)  53  SpO2: 99%  Weight: 197 lb (89.4 kg)  Height: 5' 3 (1.6 m)   Body mass index is 34.9 kg/m.  Gen: resting comfortably, no acute distress HEENT: no scleral icterus, pupils equal round and reactive, no palptable cervical adenopathy,  CV: Regular, brady 55, no m/rg, no jvd Resp: Clear to auscultation bilaterally GI: abdomen is soft, non-tender, non-distended, normal bowel sounds, no hepatosplenomegaly MSK: extremities are warm, no edema.  Skin: warm, no rash Neuro:  no focal deficits Psych: appropriate affect   Diagnostic Studies  09/2021 30 day monitor 30 day monitor Min HR 38, Max HR 131, Avg HR 61 Telemetry tracings show sinus rhythm with PVCs. Short runs of NSVT up to 5 beats.   Assessment and Plan    1.Afib/Aflutter -don't have much information on this history other than a single EKG in 2016 showing aflutter - she had been on metoprolol  but now off due to low HRs - it is not clear to me if discussions were ever had about anticoagulation, or if there was a reason it was not started in the past.  - recent 30 day monitor was benign. Routine EKGs at visits have not show recurrence - we will continue to monitor at this time, has not been evidence of recurrence since 2016.      2. HTN -at goal based on home numbers, continue to monitor   3.Bradycardia - chronic resting bradycardia, no significant symptoms and appropriate chronotropic response to activity - continues to do well, continue to monitor at this time  4. HLD - LDL at goal, continue current meds  F/u 6 months    Dorn PHEBE Ross, M.D.

## 2023-12-01 DIAGNOSIS — I10 Essential (primary) hypertension: Secondary | ICD-10-CM | POA: Diagnosis not present

## 2023-12-01 DIAGNOSIS — E559 Vitamin D deficiency, unspecified: Secondary | ICD-10-CM | POA: Diagnosis not present

## 2023-12-15 ENCOUNTER — Encounter: Payer: Self-pay | Admitting: Primary Care

## 2023-12-15 ENCOUNTER — Ambulatory Visit: Payer: PPO | Admitting: Primary Care

## 2023-12-15 VITALS — BP 151/59 | HR 63 | Temp 97.7°F | Ht 63.0 in | Wt 193.6 lb

## 2023-12-15 DIAGNOSIS — G4730 Sleep apnea, unspecified: Secondary | ICD-10-CM

## 2023-12-15 DIAGNOSIS — I10 Essential (primary) hypertension: Secondary | ICD-10-CM | POA: Diagnosis not present

## 2023-12-15 NOTE — Progress Notes (Signed)
 @Patient  ID: Felicia Wright, female    DOB: December 05, 1950, 73 y.o.   MRN: [id number]  Chief Complaint  Patient presents with   Follow-up    Referring provider: Shona Norleen PEDLAR, MD  HPI: 73 year old female, former smoker quit in 2000.  Past medical history significant for hypertension, asthma, allergic rhinitis, OSA, type 2 diabetes, hyperlipidemia, obesity.  Previous [facility] pulmonary encounter 08/11/2023 Patient presents today for sleep consult. Referred by Dr. Alvan. Long standing hx sleep apnea. When she was first diagnosed with OSA she weight 450lbs. Current weight 190lbs. She is on her third CPAP machine, feels she needs pressure settings adjusted. Current pressure 13cm h20. Sleep has been more disrupted the last 5 years. She takes benadryl  nightly. No trouble falling asleep. She sleeps on her side but will wake up on her back. She wakes up 2 hours use the restroom. She limits her fluids after 6pm. She reports 100% compliance with use. Typical bed time is 9-10pm. It takes her 30 mins to fall asleep. Wakes up 5-6 times a night. She starts her day at 8am. D[employer].   Sleep questionnaire Symptoms-I will stop breathing and sleep, I stop breathing when I had a sleep study Bedtime-9 PM to 10 PM Time to fall asleep-30 minutes Nocturnal awakenings-5-6 times Start of day-8 AM You operate heavy machinery-other than driving, no Weight changes over the past 2 years-down to 100 pounds since original sleep study Previous sleep study-yes, 15 years ago Do you currently wear CPAP-yes, pressure settings 13 cm H2O Nocturnal oxygen-no Epworth-2  Airview download 07/12/23-08/10/23 Usage 30/30 days (100%) > 4hours Average usage 8 hours 45 mins Pressure 13cm h20 Airleaks 13.8L/min AHI 3.8   12/15/2023- Interim hx  Discussed the use of AI scribe software for clinical note transcription with the patient, who gave verbal consent to proceed.  History of Present Illness   Felicia Wright is a 73  year old female with obstructive sleep apnea who presents for follow-up regarding CPAP therapy. Her brother observed that she occasionally stops breathing and then starts coughing.  Her CPAP pressure was increased to 15 cm H2O during her last visit in October, with the change implemented in December. She uses the CPAP consistently, and her residual apnea score has improved from 3.8 to 2.3 events per hour. She feels that the CPAP helps her breathe better at night, preventing the sensation of smothering.  She has experienced significant weight loss, reducing her weight from 450 pounds to 193 pounds. Despite the weight loss, she continues to experience sleep apnea. She mentions that she had 738 apneic events during her initial sleep study before starting CPAP therapy.  She was previously on metoprolol , which was stopped by her cardiologist due to low heart rate concerns. Since discontinuing metoprolol , her heart rate has increased, ranging from 50 to 70 beats per minute. She monitors her blood pressure. She is currently walking for 30 minutes with a friend. No dizziness or fainting, and no swelling in her legs or bruises.  She reports frequent urination, going to the bathroom seven times last night. She takes a cholesterol pill and magnesium at night, which helps with leg cramps but not with sleep. She uses Benadryl  to aid sleep.      Airview download 11/14/2023 - 12/13/2023 Usage days 30/30 days (100%) greater than 4 hours Average usage 9 hours 37 minutes Pressure 15 cm H2O AHI 2.3   Allergies  Allergen Reactions   Sertraline Hcl     Lips swelling & loose  stools   Bee Venom    Peach [Prunus Persica]    Strawberry Extract     Immunization History  Administered Date(s) Administered   H1N1 11/12/2008   Influenza Whole 08/14/2006, 09/11/2007   Pneumococcal Polysaccharide-23 08/14/2006    Past Medical History:  Diagnosis Date   Abnormal heart rhythm    Accidental fall    Allergic rhinitis     Aphthous ulcer    Arthritis    Asthma    Bronchitis    Carpal tunnel syndrome    Cataract    Constipation    Contusion of second toe, right    Cystitis    Depression    Diabetes mellitus type II    diet controlled   Encounter for long-term (current) use of other medications    Fibroid uterus    GERD (gastroesophageal reflux disease)    Hip pain, left    Hyperlipidemia    Hypertension    IBS (irritable bowel syndrome)    Incontinence    Malaise and fatigue    Morbid obesity (HCC)    Normocytic anemia    Oral candidiasis    Overactive bladder    Preventative health care    Reactive depression (situational)    Seizure disorder (HCC)    Sleep apnea    Ulnar neuropathy    Left    Tobacco History: Social History   Tobacco Use  Smoking Status Former   Current packs/day: 0.00   Average packs/day: 2.0 packs/day for 10.0 years (20.0 ttl pk-yrs)   Types: Cigarettes   Start date: 03/02/1989   Quit date: 03/03/1999   Years since quitting: 24.8  Smokeless Tobacco Never   Counseling given: Not Answered   Outpatient Medications Prior to Visit  Medication Sig Dispense Refill   acetaminophen  (TYLENOL ) 500 MG tablet Take 1,000 mg by mouth at bedtime as needed for pain.      amLODipine (NORVASC) 5 MG tablet Take 5 mg by mouth daily.     aspirin 81 MG tablet Take 81 mg by mouth at bedtime.     celecoxib (CELEBREX) 200 MG capsule Take 200 mg by mouth every morning.      co-enzyme Q-10 30 MG capsule Take 30 mg by mouth 3 (three) times daily.     diphenhydrAMINE  (BENADRYL ) 25 mg capsule Take 50 mg by mouth at bedtime as needed for allergies or sleep.     LINZESS 72 MCG capsule Take 72 mcg by mouth daily.     lisinopril  (ZESTRIL ) 40 MG tablet Take 40 mg by mouth daily.     magnesium gluconate (MAGONATE) 500 MG tablet Take 500 mg by mouth daily.     Misc Natural Products (BEET ROOT PO) Take by mouth.     Multiple Vitamin (MULTIVITAMIN WITH MINERALS) TABS Take 1 tablet by mouth  daily.     omeprazole (PRILOSEC) 20 MG capsule Take 20 mg by mouth daily before breakfast.      Propylene Glycol-Glycerin (SOOTHE OP) Place 1 drop into the left eye daily.     simvastatin  (ZOCOR ) 40 MG tablet Take 40 mg by mouth at bedtime.     benzonatate  (TESSALON ) 100 MG capsule Take 1 capsule (100 mg total) by mouth 3 (three) times daily as needed for cough. (Patient not taking: Reported on 08/11/2023) 30 capsule 0   cetirizine  (ZYRTEC ) 10 MG tablet Take 1 tablet (10 mg total) by mouth daily. (Patient not taking: Reported on 12/15/2023) 30 tablet 0   fluticasone  (FLONASE ) 50  MCG/ACT nasal spray Place 2 sprays into both nostrils daily. (Patient not taking: Reported on 12/15/2023) 16 g 0   No facility-administered medications prior to visit.   Review of Systems  Review of Systems  Constitutional: Negative.  Negative for fatigue.  Respiratory: Negative.    Cardiovascular: Negative.   Neurological:  Negative for dizziness and light-headedness.  Psychiatric/Behavioral:  Positive for sleep disturbance.    Physical Exam  BP (!) 151/59 (BP Location: Left Arm, Patient Position: Sitting, Cuff Size: Large)   Pulse 63   Temp 97.7 F (36.5 C) (Temporal)   Ht 5' 3 (1.6 m)   Wt 193 lb 9.6 oz (87.8 kg)   SpO2 100%   BMI 34.29 kg/m  Physical Exam Constitutional:      General: She is not in acute distress.    Appearance: Normal appearance. She is not ill-appearing.  HENT:     Head: Normocephalic and atraumatic.     Mouth/Throat:     Mouth: Mucous membranes are moist.     Pharynx: Oropharynx is clear.  Cardiovascular:     Rate and Rhythm: Bradycardia present.  Pulmonary:     Effort: Pulmonary effort is normal.     Breath sounds: Normal breath sounds.  Skin:    General: Skin is warm and dry.  Neurological:     General: No focal deficit present.     Mental Status: She is alert and oriented to person, place, and time. Mental status is at baseline.  Psychiatric:        Mood and Affect:  Mood normal.        Behavior: Behavior normal.        Thought Content: Thought content normal.        Judgment: Judgment normal.      Lab Results:  CBC    Component Value Date/Time   WBC 5.5 02/22/2013 1145   RBC 4.18 02/22/2013 1145   HGB 12.4 02/22/2013 1145   HCT 36.4 02/22/2013 1145   PLT 124 (L) 02/22/2013 1145   MCV 87.1 02/22/2013 1145   MCH 29.7 02/22/2013 1145   MCHC 34.1 02/22/2013 1145   RDW 14.1 02/22/2013 1145   LYMPHSABS 1.6 08/25/2010 1244   MONOABS 0.4 08/25/2010 1244   EOSABS 0.2 08/25/2010 1244   BASOSABS 0.0 08/25/2010 1244    BMET    Component Value Date/Time   NA 132 (L) 02/22/2013 1145   K 4.2 02/22/2013 1145   CL 98 02/22/2013 1145   CO2 26 02/22/2013 1145   GLUCOSE 86 02/22/2013 1145   BUN 20 02/22/2013 1145   CREATININE 0.85 02/22/2013 1145   CALCIUM  10.0 02/22/2013 1145   GFRNONAA 71 (L) 02/22/2013 1145   GFRAA 83 (L) 02/22/2013 1145    BNP No results found for: BNP  ProBNP No results found for: PROBNP  Imaging: No results found.   Assessment & Plan:   1. Sleep apnea, unspecified type (Primary) - Ambulatory Referral for DME     Obstructive Sleep Apnea (OSA) Despite significant weight loss, OSA persists. Hx aflutter. Patient is 100% compliant with CPAP use. Continues to have occasional witnessed apnea/waking up coughing during her sleep. Current pressure 15cm h20; Residual AHI improved 2.3/hour (previously 3.8/hour). -Increase CPAP pressure to 16. -Continue current CPAP use nightly 4-6 hours.  Hypertension Off metoprolol  due to low heart rate. Current blood pressure 122/56, no symptoms of dizziness or faintness. -Continue current management.      Almarie LELON Ferrari, NP 12/15/2023

## 2023-12-15 NOTE — Patient Instructions (Signed)
 Great seeing you today Felicia Wright  Your sleep apnea is controlled on current settings, you have an occasional apneic event. We will increase pressure setting to 16cm h20  Follow-up 6 months with [facility] NP or sooner if needed   CPAP and BIPAP Information CPAP and BIPAP use air pressure to keep your airways open and help you breathe well. CPAP and BIPAP use different amounts of pressure. Your health care provider will tell you whether CPAP or BIPAP would be best for you. CPAP stands for continuous positive airway pressure. With CPAP, the amount of pressure stays the same while you breathe in and out. BIPAP stands for bi-level positive airway pressure. With BIPAP, the amount of pressure will be higher when you breathe in and lower when you breathe out. This allows you to take bigger breaths. CPAP or BIPAP may be used in the hospital or at home. You may need to have a sleep study before your provider can order a device for you to use at home. What are the advantages? CPAP and BIPAP are most often used for obstructive sleep apnea to keep the airways from collapsing when the muscles relax during sleep. CPAP or BIPAP can be used if you have: Chronic obstructive pulmonary disease. Heart failure. Medical conditions that cause muscle weakness. Other problems that cause breathing to be shallow, weak, or difficult. What are the risks? Your provider will talk with you about risks. These may include: Sores on your nose or face caused from the mask, prongs, or nasal pillows. Dry or stuffy nose or nosebleeds. Feeling gassy or bloated. Sinus or lung infection if the equipment is not cleaned well. When should CPAP or BIPAP be used? In most cases, CPAP or BIPAP is used during sleep at night or whenever the main sleep time happens. It's also used during naps. People with some medical conditions may need to wear the mask when they're awake. Follow instructions from your provider about when to use your CPAP or  BIPAP. What happens during CPAP or BIPAP?  Both CPAP and BIPAP use a small machine that uses electricity to create air pressure. A long tube connects the device to a plastic mask. Air is blown through the mask into your nose or mouth. The amount of pressure that's used to blow the air can be adjusted. Your provider will set the pressure setting and help you find the best mask for you. Tips for using the mask There are different types and sizes of masks. If your mask does not fit well, talk with your provider about getting a different one. Some common types of masks include: Full face masks, which fit over the mouth and nose. Nasal masks, which fit over the nose. Nasal pillow or prong masks, which fit into the nostrils. The mask needs to be snug to your face, so some people feel trapped or closed in at first. If you feel this way, you may need to get used to the mask. Hold the mask loosely over your nose or mouth and then gradually put the the mask on more snugly. Slowly increase the amount of time you use the mask. If you have trouble with your mask not fitting well or leaking, talk with your provider. Do not stop using the mask. Tips for using the device Follow instructions from your provider about how to and how often to use the device. For home use, CPAP and BIPAP devices come from home health care companies. There are many different brands. Your health  insurance company will help to decide which device you get. Keep the CPAP or BIPAP device and attachments clean. Ask your home health care company or check the instruction book for cleaning instructions. Make sure the humidifier is filled with germ-free (sterile) water  and is working correctly. This will help prevent a dry or stuffy nose or nosebleeds. A nasal saline mist or spray may keep your nose from getting dry and sore. Do not eat or drink while the CPAP or BIPAP device is on. Food or drinks could get pushed into your lungs by the pressure  of the CPAP or BIPAP. Follow these instructions at home: Take over-the-counter and prescription medicines only as told by your provider. Do not smoke, vape, or use nicotine or tobacco. Contact a health care provider if: You have redness or pressure sores on your head, face, mouth, or nose from the mask or headgear. You have trouble using the CPAP or BIPAP device. You have trouble going to sleep or staying asleep. Someone tells you that you snore even when wearing your CPAP or BIPAP device. Get help right away if: You have trouble breathing. You feel confused. These symptoms may be an emergency. Get help right away. Call 911. Do not wait to see if the symptoms will go away. Do not drive yourself to the hospital. This information is not intended to replace advice given to you by your health care provider. Make sure you discuss any questions you have with your health care provider. Document Revised: 02/15/2023 Document Reviewed: 02/15/2023 Elsevier Patient Education  2024 [employer].

## 2024-01-30 DIAGNOSIS — E669 Obesity, unspecified: Secondary | ICD-10-CM | POA: Diagnosis not present

## 2024-01-30 DIAGNOSIS — K5900 Constipation, unspecified: Secondary | ICD-10-CM | POA: Diagnosis not present

## 2024-01-30 DIAGNOSIS — J309 Allergic rhinitis, unspecified: Secondary | ICD-10-CM | POA: Diagnosis not present

## 2024-01-30 DIAGNOSIS — M8580 Other specified disorders of bone density and structure, unspecified site: Secondary | ICD-10-CM | POA: Diagnosis not present

## 2024-01-30 DIAGNOSIS — Z7982 Long term (current) use of aspirin: Secondary | ICD-10-CM | POA: Diagnosis not present

## 2024-01-30 DIAGNOSIS — G4733 Obstructive sleep apnea (adult) (pediatric): Secondary | ICD-10-CM | POA: Diagnosis not present

## 2024-01-30 DIAGNOSIS — H04123 Dry eye syndrome of bilateral lacrimal glands: Secondary | ICD-10-CM | POA: Diagnosis not present

## 2024-01-30 DIAGNOSIS — E785 Hyperlipidemia, unspecified: Secondary | ICD-10-CM | POA: Diagnosis not present

## 2024-01-30 DIAGNOSIS — I10 Essential (primary) hypertension: Secondary | ICD-10-CM | POA: Diagnosis not present

## 2024-01-30 DIAGNOSIS — M1990 Unspecified osteoarthritis, unspecified site: Secondary | ICD-10-CM | POA: Diagnosis not present

## 2024-01-30 DIAGNOSIS — I4891 Unspecified atrial fibrillation: Secondary | ICD-10-CM | POA: Diagnosis not present

## 2024-01-30 DIAGNOSIS — K219 Gastro-esophageal reflux disease without esophagitis: Secondary | ICD-10-CM | POA: Diagnosis not present

## 2024-04-15 ENCOUNTER — Telehealth: Payer: Self-pay | Admitting: Cardiology

## 2024-04-15 NOTE — Telephone Encounter (Signed)
   Come and goes  STAT if patient feels like he/she is going to faint   1. Are you feeling dizzy, lightheaded, or faint right now? No     2. Have you passed out?  No  (If yes move to [facility])   3. Do you have any other symptoms?    4. Have you checked your HR and BP (record if available)? HR 45 BP 135/60 something 120/60 something     Pt said, she's been having dizzy spells for couple of weeks, every time she walks or doing errands like going to the groceries she will feel dizzy. She will stop and rest and after few minutes she will fell fine.  Also, I provided new appt with Dr. Amanda Jungling on 09/04 at 3:40 pm

## 2024-04-15 NOTE — Telephone Encounter (Signed)
 After speaking with patient, she reports she has been taking benadryl  during the day because her allergies are bad for the past few weeks.  Normally she take benadryl  nightly for sleep. Her feelings of dizziness are transient and when she sits down and rests, symptoms go away. Denies palpitations or tachycardia.  I encouraged her to switch from benadryl  to allegra, zyrtec , or claritin . She is also going to look into melatonin for sleep.I also advise her not to drive if she takes benadryl  as she said it sedated her. She has follow scheduled in September.    I will FYI Dr.Branch.

## 2024-04-16 NOTE — Telephone Encounter (Signed)
 Returned call to pt. No answer. Left msg to call back.

## 2024-04-17 NOTE — Telephone Encounter (Signed)
 Pt.notified

## 2024-04-17 NOTE — Telephone Encounter (Signed)
 Patient returned RN's call.

## 2024-05-21 DIAGNOSIS — H43392 Other vitreous opacities, left eye: Secondary | ICD-10-CM | POA: Diagnosis not present

## 2024-05-30 DIAGNOSIS — I10 Essential (primary) hypertension: Secondary | ICD-10-CM | POA: Diagnosis not present

## 2024-05-30 DIAGNOSIS — E559 Vitamin D deficiency, unspecified: Secondary | ICD-10-CM | POA: Diagnosis not present

## 2024-06-05 DIAGNOSIS — E785 Hyperlipidemia, unspecified: Secondary | ICD-10-CM | POA: Diagnosis not present

## 2024-06-05 DIAGNOSIS — I10 Essential (primary) hypertension: Secondary | ICD-10-CM | POA: Diagnosis not present

## 2024-06-05 DIAGNOSIS — E559 Vitamin D deficiency, unspecified: Secondary | ICD-10-CM | POA: Diagnosis not present

## 2024-06-05 DIAGNOSIS — K5900 Constipation, unspecified: Secondary | ICD-10-CM | POA: Diagnosis not present

## 2024-06-05 DIAGNOSIS — K219 Gastro-esophageal reflux disease without esophagitis: Secondary | ICD-10-CM | POA: Diagnosis not present

## 2024-06-05 DIAGNOSIS — M8580 Other specified disorders of bone density and structure, unspecified site: Secondary | ICD-10-CM | POA: Diagnosis not present

## 2024-06-05 DIAGNOSIS — D649 Anemia, unspecified: Secondary | ICD-10-CM | POA: Diagnosis not present

## 2024-06-05 DIAGNOSIS — I4891 Unspecified atrial fibrillation: Secondary | ICD-10-CM | POA: Diagnosis not present

## 2024-06-05 DIAGNOSIS — D696 Thrombocytopenia, unspecified: Secondary | ICD-10-CM | POA: Diagnosis not present

## 2024-06-05 DIAGNOSIS — G4733 Obstructive sleep apnea (adult) (pediatric): Secondary | ICD-10-CM | POA: Diagnosis not present

## 2024-06-05 DIAGNOSIS — M1711 Unilateral primary osteoarthritis, right knee: Secondary | ICD-10-CM | POA: Diagnosis not present

## 2024-06-13 ENCOUNTER — Ambulatory Visit: Payer: HMO | Admitting: Primary Care

## 2024-06-13 ENCOUNTER — Encounter: Payer: Self-pay | Admitting: Primary Care

## 2024-06-13 VITALS — BP 159/73 | HR 51 | Temp 97.6°F | Ht 63.0 in | Wt 182.8 lb

## 2024-06-13 DIAGNOSIS — G4733 Obstructive sleep apnea (adult) (pediatric): Secondary | ICD-10-CM

## 2024-06-13 NOTE — Patient Instructions (Signed)
  VISIT SUMMARY: Today, we discussed your progress with managing obstructive sleep apnea using CPAP therapy. You have been compliant with your treatment, and your condition has significantly improved. We also addressed the nasal dryness and irritation you have been experiencing due to CPAP use.  YOUR PLAN: -OBSTRUCTIVE SLEEP APNEA: Obstructive sleep apnea is a condition where your breathing repeatedly stops and starts during sleep. Your condition is well controlled with CPAP therapy at a pressure setting of 16 cm H2O. Your apnea-hypopnea index (AHI) has improved, and you are experiencing better sleep quality. Continue using your CPAP every night at the current settings. There is no need to adjust the pressure unless your symptoms change. We will inform your cardiologist, Dr. Alvan, that your sleep apnea is well controlled. Please schedule a follow-up appointment in one year.  -NASAL DRYNESS AND IRRITATION: Nasal dryness and irritation can occur due to CPAP use. You have been managing this by increasing the humidifier setting and using saline spray. To further alleviate the discomfort, I recommend using Ayr nasal gel.  INSTRUCTIONS: Please schedule a follow-up appointment in one year to monitor your obstructive sleep apnea. Continue to use your CPAP every night and inform us  if you experience any changes in your symptoms.

## 2024-06-13 NOTE — Progress Notes (Signed)
 @Patient  ID: Felicia Wright, female    DOB: November 09, 1950, 73 y.o.   MRN: [id number]  Chief Complaint  Patient presents with   Obstructive Sleep Apnea    No complaints- pt did have to raise up the humidity on the machine and pt states she has started dreaming    Referring provider: Shona Norleen PEDLAR, MD  HPI: 73 year old female, former smoker quit in 2000.  Past medical history significant for hypertension, asthma, allergic rhinitis, OSA, type 2 diabetes, hyperlipidemia, obesity.  Previous [facility] pulmonary encounter 08/11/2023 Patient presents today for sleep consult. Referred by Dr. Alvan. Long standing hx sleep apnea. When she was first diagnosed with OSA she weight 450lbs. Current weight 190lbs. She is on her third CPAP machine, feels she needs pressure settings adjusted. Current pressure 13cm h20. Sleep has been more disrupted the last 5 years. She takes benadryl  nightly. No trouble falling asleep. She sleeps on her side but will wake up on her back. She wakes up 2 hours use the restroom. She limits her fluids after 6pm. She reports 100% compliance with use. Typical bed time is 9-10pm. It takes her 30 mins to fall asleep. Wakes up 5-6 times a night. She starts her day at 8am. D[employer].   Sleep questionnaire Symptoms-I will stop breathing and sleep, I stop breathing when I had a sleep study Bedtime-9 PM to 10 PM Time to fall asleep-30 minutes Nocturnal awakenings-5-6 times Start of day-8 AM You operate heavy machinery-other than driving, no Weight changes over the past 2 years-down to 100 pounds since original sleep study Previous sleep study-yes, 15 years ago Do you currently wear CPAP-yes, pressure settings 13 cm H2O Nocturnal oxygen-no Epworth-2  Airview download 07/12/23-08/10/23 Usage 30/30 days (100%) > 4hours Average usage 8 hours 45 mins Pressure 13cm h20 Airleaks 13.8L/min AHI 3.8   12/15/2023 Discussed the use of AI scribe software for clinical note transcription  with the patient, who gave verbal consent to proceed.  History of Present Illness   Felicia Wright is a 73 year old female with obstructive sleep apnea who presents for follow-up regarding CPAP therapy.   Her CPAP pressure was increased to 16 cm H2O during her last visit in October, with the change implemented in December. She uses the CPAP consistently, and her residual apnea score has improved from 3.8 to 2.3 events per hour. She feels that the CPAP helps her breathe better at night, preventing the sensation of smothering.  She has experienced significant weight loss, reducing her weight from 450 pounds to 193 pounds. Despite the weight loss, she continues to experience sleep apnea. She mentions that she had 738 apneic events during her initial sleep study before starting CPAP therapy.  She was previously on metoprolol , which was stopped by her cardiologist due to low heart rate concerns. Since discontinuing metoprolol , her heart rate has increased, ranging from 50 to 70 beats per minute. She monitors her blood pressure. She is currently walking for 30 minutes with a friend. No dizziness or fainting, and no swelling in her legs or bruises.  She reports frequent urination, going to the bathroom seven times last night. She takes a cholesterol pill and magnesium at night, which helps with leg cramps but not with sleep. She uses Benadryl  to aid sleep.      Airview download 11/14/2023 - 12/13/2023 Usage days 30/30 days (100%) greater than 4 hours Average usage 9 hours 37 minutes Pressure 15 cm H2O AHI 2.3    06/13/2024- Interim hx  Discussed the use of AI scribe software for clinical note transcription with the patient, who gave verbal consent to proceed.  History of Present Illness Felicia Wright is a 73 year old female with sleep apnea who presents for a follow-up visit. She was referred by Dr. Alvan.  She has a history of atrial fibrillation and has been compliant with her CPAP therapy. Her  CPAP pressure was increased from 15 to 16, which has improved her apnea score. She is dreaming again. She is not waking up gasping or choking and feels she is sleeping through the night. Her average CPAP usage is 9 hours and 58 minutes per night over the last 30 days.  She has experienced nasal dryness and sores due to the CPAP, which she managed by increasing the humidifier setting. She has been using saline spray and applying it with a Q-tip to alleviate discomfort. The nasal sores are beginning to heal.  She reports a significant weight loss of 12 pounds since her last visit, now weighing 182 pounds.   Airview download 05/13/24-06/11/24 Usage days 30/30 (100%) > 4 hours  Average usage 9 hours 58 mins Pressure 16cm h20 Airleaks 18.3L/min AHI 1.6   Allergies  Allergen Reactions   Sertraline Hcl     Lips swelling & loose stools   Bee Venom    Peach [Prunus Persica]    Strawberry Extract     Immunization History  Administered Date(s) Administered   H1N1 11/12/2008   Influenza Whole 08/14/2006, 09/11/2007   Pneumococcal Polysaccharide-23 08/14/2006    Past Medical History:  Diagnosis Date   Abnormal heart rhythm    Accidental fall    Allergic rhinitis    Aphthous ulcer    Arthritis    Asthma    Bronchitis    Carpal tunnel syndrome    Cataract    Constipation    Contusion of second toe, right    Cystitis    Depression    Diabetes mellitus type II    diet controlled   Encounter for long-term (current) use of other medications    Fibroid uterus    GERD (gastroesophageal reflux disease)    Hip pain, left    Hyperlipidemia    Hypertension    IBS (irritable bowel syndrome)    Incontinence    Malaise and fatigue    Morbid obesity (HCC)    Normocytic anemia    Oral candidiasis    Overactive bladder    Preventative health care    Reactive depression (situational)    Seizure disorder (HCC)    Sleep apnea    Ulnar neuropathy    Left    Tobacco History: Social  History   Tobacco Use  Smoking Status Former   Current packs/day: 0.00   Average packs/day: 2.0 packs/day for 10.0 years (20.0 ttl pk-yrs)   Types: Cigarettes   Start date: 03/02/1989   Quit date: 03/03/1999   Years since quitting: 25.2  Smokeless Tobacco Never   Counseling given: Not Answered   Outpatient Medications Prior to Visit  Medication Sig Dispense Refill   acetaminophen  (TYLENOL ) 500 MG tablet Take 1,000 mg by mouth at bedtime as needed for pain.      amLODipine (NORVASC) 5 MG tablet Take 5 mg by mouth daily.     aspirin 81 MG tablet Take 81 mg by mouth at bedtime.     Blood Glucose Monitoring Suppl (ONE TOUCH ULTRA 2) w/Device KIT as directed.     celecoxib (CELEBREX) 200 MG capsule  Take 200 mg by mouth every morning.      co-enzyme Q-10 30 MG capsule Take 30 mg by mouth 3 (three) times daily.     diphenhydrAMINE  (BENADRYL ) 25 mg capsule Take 50 mg by mouth at bedtime as needed for allergies or sleep.     Lancets (ONETOUCH DELICA PLUS LANCET33G) MISC Apply topically daily.     LINZESS 72 MCG capsule Take 72 mcg by mouth daily.     lisinopril  (ZESTRIL ) 40 MG tablet Take 40 mg by mouth daily.     magnesium gluconate (MAGONATE) 500 MG tablet Take 500 mg by mouth daily.     Misc Natural Products (BEET ROOT PO) Take by mouth.     Multiple Vitamin (MULTIVITAMIN WITH MINERALS) TABS Take 1 tablet by mouth daily.     omeprazole (PRILOSEC) 20 MG capsule Take 20 mg by mouth daily before breakfast.      ONETOUCH ULTRA test strip daily.     Propylene Glycol-Glycerin (SOOTHE OP) Place 1 drop into the left eye daily.     simvastatin  (ZOCOR ) 40 MG tablet Take 40 mg by mouth at bedtime.     No facility-administered medications prior to visit.   Review of Systems  Review of Systems  Constitutional: Negative.  Negative for fatigue.  Respiratory: Negative.  Negative for apnea.   Psychiatric/Behavioral:  Negative for sleep disturbance.     Physical Exam  BP (!) 159/73   Pulse (!)  51   Temp 97.6 F (36.4 C)   Ht 5' 3 (1.6 m)   Wt 182 lb 12.8 oz (82.9 kg)   SpO2 100% Comment: RA  BMI 32.38 kg/m  Physical Exam Constitutional:      Appearance: Normal appearance. She is well-developed.  HENT:     Head: Normocephalic and atraumatic.     Mouth/Throat:     Mouth: Mucous membranes are moist.     Pharynx: Oropharynx is clear.  Eyes:     Pupils: Pupils are equal, round, and reactive to light.  Cardiovascular:     Rate and Rhythm: Normal rate and regular rhythm.     Heart sounds: Normal heart sounds. No murmur heard. Pulmonary:     Effort: Pulmonary effort is normal. No respiratory distress.     Breath sounds: Normal breath sounds. No wheezing or rhonchi.  Abdominal:     General: Bowel sounds are normal.     Palpations: Abdomen is soft.     Tenderness: There is no abdominal tenderness.  Musculoskeletal:        General: Normal range of motion.     Cervical back: Normal range of motion and neck supple.  Skin:    General: Skin is warm and dry.     Findings: No erythema or rash.  Neurological:     General: No focal deficit present.     Mental Status: She is alert and oriented to person, place, and time. Mental status is at baseline.  Psychiatric:        Mood and Affect: Mood normal.        Behavior: Behavior normal.        Thought Content: Thought content normal.        Judgment: Judgment normal.     Lab Results:  CBC    Component Value Date/Time   WBC 5.5 02/22/2013 1145   RBC 4.18 02/22/2013 1145   HGB 12.4 02/22/2013 1145   HCT 36.4 02/22/2013 1145   PLT 124 (L) 02/22/2013 1145   MCV  87.1 02/22/2013 1145   MCH 29.7 02/22/2013 1145   MCHC 34.1 02/22/2013 1145   RDW 14.1 02/22/2013 1145   LYMPHSABS 1.6 08/25/2010 1244   MONOABS 0.4 08/25/2010 1244   EOSABS 0.2 08/25/2010 1244   BASOSABS 0.0 08/25/2010 1244    BMET    Component Value Date/Time   NA 132 (L) 02/22/2013 1145   K 4.2 02/22/2013 1145   CL 98 02/22/2013 1145   CO2 26  02/22/2013 1145   GLUCOSE 86 02/22/2013 1145   BUN 20 02/22/2013 1145   CREATININE 0.85 02/22/2013 1145   CALCIUM  10.0 02/22/2013 1145   GFRNONAA 71 (L) 02/22/2013 1145   GFRAA 83 (L) 02/22/2013 1145    BNP No results found for: BNP  ProBNP No results found for: PROBNP  Imaging: No results found.   Assessment & Plan:   1. OSA (obstructive sleep apnea) (Primary)  Assessment and Plan Assessment & Plan Obstructive sleep apnea Obstructive sleep apnea is well controlled with CPAP at a pressure setting of 16 cm H2O. The apnea-hypopnea index (AHI) has improved from 2.3 to 1.6 events per hour, indicating effective management as it is under 5 events per hour. She reports improved sleep quality, including dreaming, suggesting adequate REM sleep. She is compliant with CPAP use, averaging 9 hours and 58 minutes per night over the last 30 days.  - No changes needed today  - Continue CPAP at current settings of 16 cm H2O. - Ensure consistent nightly use of CPAP. - Inform cardiologist (Dr. Alvan) that sleep apnea is well controlled. - Schedule follow-up in one year.  Nasal dryness and irritation secondary to CPAP use She experiences nasal dryness and irritation due to CPAP use, leading to sores in the nose. Increasing the humidifier setting on the CPAP has alleviated symptoms, and the sores are beginning to heal. Saline spray is somewhat helpful but causes discomfort when applied directly. - Recommend using Ayr nasal gel for nasal dryness and irritation.   Almarie LELON Ferrari, NP 06/13/2024

## 2024-06-27 ENCOUNTER — Other Ambulatory Visit: Payer: Self-pay | Admitting: Internal Medicine

## 2024-06-27 DIAGNOSIS — Z1231 Encounter for screening mammogram for malignant neoplasm of breast: Secondary | ICD-10-CM

## 2024-06-28 DIAGNOSIS — G4733 Obstructive sleep apnea (adult) (pediatric): Secondary | ICD-10-CM | POA: Diagnosis not present

## 2024-07-10 ENCOUNTER — Ambulatory Visit: Admitting: Cardiology

## 2024-07-11 ENCOUNTER — Ambulatory Visit: Attending: Cardiology | Admitting: Cardiology

## 2024-07-11 ENCOUNTER — Ambulatory Visit: Attending: Cardiology

## 2024-07-11 VITALS — BP 133/68 | HR 52 | Ht 63.0 in | Wt 182.0 lb

## 2024-07-11 DIAGNOSIS — E782 Mixed hyperlipidemia: Secondary | ICD-10-CM | POA: Diagnosis not present

## 2024-07-11 DIAGNOSIS — R42 Dizziness and giddiness: Secondary | ICD-10-CM

## 2024-07-11 DIAGNOSIS — I4891 Unspecified atrial fibrillation: Secondary | ICD-10-CM | POA: Diagnosis not present

## 2024-07-11 DIAGNOSIS — I10 Essential (primary) hypertension: Secondary | ICD-10-CM | POA: Diagnosis not present

## 2024-07-11 NOTE — Patient Instructions (Signed)
 Medication Instructions:  Your physician recommends that you continue on your current medications as directed. Please refer to the Current Medication list given to you today.  *If you need a refill on your cardiac medications before your next appointment, please call your pharmacy*  Lab Work: None today If you have labs (blood work) drawn today and your tests are completely normal, you will receive your results only by: MyChart Message (if you have MyChart) OR A paper copy in the mail If you have any lab test that is abnormal or we need to change your treatment, we will call you to review the results.  Testing/Procedures: ZIO XT- Long Term Monitor Instructions   Your physician has requested you wear your ZIO patch monitor____14___days.   This is a single patch monitor.  [employer] supplies one patch monitor per enrollment.  Additional stickers are not available.   Please do not apply patch if you will be having a Nuclear Stress Test, Echocardiogram, Cardiac CT, MRI, or Chest Xray during the time frame you would be wearing the monitor. The patch cannot be worn during these tests.  You cannot remove and re-apply the ZIO XT patch monitor.   Do not shower for the first 24 hours.  You may shower after the first 24 hours.   Press button if you feel a symptom. You will hear a small click.  Record Date, Time and Symptom in the Patient Log Book.   When you are ready to remove patch, follow instructions on last 2 pages of Patient Log Book.  Stick patch monitor onto last page of Patient Log Book.   Place Patient Log Book in [place] box.  Use locking tab on box and tape box closed securely.  The [employer] has [employer] on it.  Please place in mailbox as soon as possible.  Your physician should have your test results approximately 7 days after the monitor has been mailed back to [facility].   Call [facility] Customer Care at [phone] if you have questions regarding your ZIO XT  patch monitor.  Call them immediately if you see an orange light blinking on your monitor.   If your monitor falls off in less than 4 days contact our Monitor department at [phone].  If your monitor becomes loose or falls off after 4 days call [employer] at [phone] for suggestions on securing your monitor.    Follow-Up: At [facility], you and your health needs are our priority.  As part of our continuing mission to provide you with exceptional heart care, our providers are all part of one team.  This team includes your primary Cardiologist (physician) and Advanced Practice Providers or APPs (Physician Assistants and Nurse Practitioners) who all work together to provide you with the care you need, when you need it.  Your next appointment:   3 month(s)  Provider:   You will see one of the following Advanced Practice Providers on your designated Care Team:   [place], PA-C  [place], [place] Olivia Pavy, [place]   We recommend signing up for the patient portal called MyChart.  Sign up information is provided on this After Visit Summary.  MyChart is used to connect with patients for Virtual Visits (Telemedicine).  Patients are able to view lab/test results, encounter notes, upcoming appointments, etc.  Non-urgent messages can be sent to your provider as well.   To learn more about what you can do with MyChart, go to [email or web address].   Other Instructions  None

## 2024-07-11 NOTE — Progress Notes (Signed)
 Clinical Summary Felicia Wright is a 73 y.o.female seen today for follow up of the following medical problems.    1.Afib/aflutter - diagnosis list in pcp note, details are unclear - apparently had been seeing Dr Rennis prior to current pcp, I do not have these records - had been on metoprolol  but recently stopped due to low HRs - has not been on anticoagulation, unclear why not. She denies any recent bleeding issues.  - EKGs in epic reviewed, do not see afib. Do see aflutter in 08/12/2015.       09/2021 30 day monitor: no afib or aflutter - resting HRs low 50s at times.  - can feel lightheaded at times. Working to stay well hydrated. Symptoms started about 3 months ago. Occurs about once a week.    2. HTN -she is compliant with meds - home bp's 120s-130s/ 60s. HRs 50s primarily, rarely high 40s.      3.OSA - followed by pulmonary   4. HLD - 05/2023 TC 203 TG 57 HDL 126 LDL 66 - compliant with meds Past Medical History:  Diagnosis Date   Abnormal heart rhythm    Accidental fall    Allergic rhinitis    Aphthous ulcer    Arthritis    Asthma    Bronchitis    Carpal tunnel syndrome    Cataract    Constipation    Contusion of second toe, right    Cystitis    Depression    Diabetes mellitus type II    diet controlled   Encounter for long-term (current) use of other medications    Fibroid uterus    GERD (gastroesophageal reflux disease)    Hip pain, left    Hyperlipidemia    Hypertension    IBS (irritable bowel syndrome)    Incontinence    Malaise and fatigue    Morbid obesity (HCC)    Normocytic anemia    Oral candidiasis    Overactive bladder    Preventative health care    Reactive depression (situational)    Seizure disorder (HCC)    Sleep apnea    Ulnar neuropathy    Left     Allergies  Allergen Reactions   Sertraline Hcl     Lips swelling & loose stools   Bee Venom    Peach [Prunus Persica]    Strawberry Extract      Current Outpatient  Medications  Medication Sig Dispense Refill   acetaminophen  (TYLENOL ) 500 MG tablet Take 1,000 mg by mouth at bedtime as needed for pain.      amLODipine (NORVASC) 5 MG tablet Take 5 mg by mouth daily.     aspirin 81 MG tablet Take 81 mg by mouth at bedtime.     Blood Glucose Monitoring Suppl (ONE TOUCH ULTRA 2) w/Device KIT as directed.     celecoxib (CELEBREX) 200 MG capsule Take 200 mg by mouth every morning.      co-enzyme Q-10 30 MG capsule Take 30 mg by mouth 3 (three) times daily.     Lancets (ONETOUCH DELICA PLUS LANCET33G) MISC Apply topically daily.     LINZESS 72 MCG capsule Take 72 mcg by mouth daily.     lisinopril  (ZESTRIL ) 40 MG tablet Take 40 mg by mouth daily.     loratadine (CLARITIN REDITABS) 10 MG dissolvable tablet Take 10 mg by mouth daily.     magnesium gluconate (MAGONATE) 500 MG tablet Take 500 mg by mouth daily.  Misc Natural Products (BEET ROOT PO) Take by mouth.     Multiple Vitamin (MULTIVITAMIN WITH MINERALS) TABS Take 1 tablet by mouth daily.     omeprazole (PRILOSEC) 20 MG capsule Take 20 mg by mouth daily before breakfast.      ONETOUCH ULTRA test strip daily.     Propylene Glycol-Glycerin (SOOTHE OP) Place 1 drop into the left eye daily.     simvastatin  (ZOCOR ) 40 MG tablet Take 40 mg by mouth at bedtime.     tobramycin-dexamethasone  (TOBRADEX) ophthalmic ointment as needed (irritation).     diphenhydrAMINE  (BENADRYL ) 25 mg capsule Take 50 mg by mouth at bedtime as needed for allergies or sleep. (Patient not taking: Reported on 07/11/2024)     No current facility-administered medications for this visit.     Past Surgical History:  Procedure Laterality Date   BREAST BIOPSY  03/07/2012   Procedure: BREAST BIOPSY WITH NEEDLE LOCALIZATION;  Surgeon: Vicenta DELENA Poli, MD;  Location: [facility];  Service: General;  Laterality: Right;  needle localized right breast lumpectomy   BREAST EXCISIONAL BIOPSY Right    right   CARPAL TUNNEL RELEASE      Right   CHOLECYSTECTOMY     COLONOSCOPY N/A 08/12/2015   Procedure: COLONOSCOPY;  Surgeon: Claudis RAYMOND Rivet, MD;  Location: [facility];  Service: Endoscopy;  Laterality: N/A;  955   DILATION AND CURETTAGE OF UTERUS     HEEL SPUR SURGERY     Right   HEMORROIDECTOMY     KNEE ARTHROSCOPY     Right   NASAL SINUS SURGERY  1983   TONSILLECTOMY     TOTAL SHOULDER ARTHROPLASTY Right 02/28/2013   Procedure: RIGHT TOTAL SHOULDER ARTHROPLASTY;  Surgeon: Franky CHRISTELLA Pointer, MD;  Location: [facility];  Service: Orthopedics;  Laterality: Right;     Allergies  Allergen Reactions   Sertraline Hcl     Lips swelling & loose stools   Bee Venom    Peach [Prunus Persica]    Strawberry Extract       Family History  Problem Relation Age of Onset   Hypertension Mother    Heart failure Mother    Fibroids Sister    Hypertension Brother    Diabetes Brother    Arthritis Other    Coronary artery disease Other    Asthma Other      Social History Ms. Zuba reports that she quit smoking about 25 years ago. Her smoking use included cigarettes. She started smoking about 35 years ago. She has a 20 pack-year smoking history. She has never used smokeless tobacco. Ms. Bramblett reports no history of alcohol use.     Physical Examination Vitals:   07/11/24 1532  BP: 133/68  Pulse: (!) 52  SpO2: 97%   Filed Weights   07/11/24 1532  Weight: 182 lb (82.6 kg)    Gen: resting comfortably, no acute distress HEENT: no scleral icterus, pupils equal round and reactive, no palptable cervical adenopathy,  CV: RRR, no m/rg, no jvd Resp: Clear to auscultation bilaterally GI: abdomen is soft, non-tender, non-distended, normal bowel sounds, no hepatosplenomegaly MSK: extremities are warm, no edema.  Skin: warm, no rash Neuro:  no focal deficits Psych: appropriate affect   Diagnostic Studies  09/2021 30 day monitor 30 day monitor Min HR 38, Max HR 131, Avg HR 61 Telemetry tracings show sinus rhythm with  PVCs. Short runs of NSVT up to 5 beats.   Assessment and Plan  1.Afib/Aflutter -don't have  much information on this history other than a single EKG in 2016 showing aflutter - she had been on metoprolol  but now off due to low HRs - it is not clear to me if discussions were ever had about anticoagulation, or if there was a reason it was not started in the past.  - recent 30 day monitor was benign. Routine EKGs at visits have not show recurrence  - with recent intermittent dizziness will plan to repeat monitor     2. HTN -at goal, continue current meds   3.Bradycardia - some recent dizziness, plan for zio patch   4. HLD -at goal, continue current meds      Dorn PHEBE Ross, M.D.

## 2024-07-15 DIAGNOSIS — H9203 Otalgia, bilateral: Secondary | ICD-10-CM | POA: Diagnosis not present

## 2024-07-15 DIAGNOSIS — G8929 Other chronic pain: Secondary | ICD-10-CM | POA: Diagnosis not present

## 2024-07-29 DIAGNOSIS — R42 Dizziness and giddiness: Secondary | ICD-10-CM | POA: Diagnosis not present

## 2024-07-30 ENCOUNTER — Ambulatory Visit: Admitting: Cardiology

## 2024-08-05 ENCOUNTER — Ambulatory Visit
Admission: RE | Admit: 2024-08-05 | Discharge: 2024-08-05 | Disposition: A | Discharge status: Home / Self Care | Source: Ambulatory Visit | Attending: Internal Medicine | Admitting: Internal Medicine

## 2024-08-05 DIAGNOSIS — Z1231 Encounter for screening mammogram for malignant neoplasm of breast: Secondary | ICD-10-CM

## 2024-08-08 ENCOUNTER — Encounter (INDEPENDENT_AMBULATORY_CARE_PROVIDER_SITE_OTHER): Payer: Self-pay | Admitting: Otolaryngology

## 2024-08-08 ENCOUNTER — Ambulatory Visit (INDEPENDENT_AMBULATORY_CARE_PROVIDER_SITE_OTHER): Admitting: Otolaryngology

## 2024-08-08 VITALS — BP 147/68 | HR 58 | Temp 97.9°F | Ht 63.0 in | Wt 179.0 lb

## 2024-08-08 DIAGNOSIS — H9202 Otalgia, left ear: Secondary | ICD-10-CM

## 2024-08-08 DIAGNOSIS — H6982 Other specified disorders of Eustachian tube, left ear: Secondary | ICD-10-CM

## 2024-08-08 DIAGNOSIS — H9312 Tinnitus, left ear: Secondary | ICD-10-CM

## 2024-08-08 MED ORDER — METHOCARBAMOL 500 MG PO TABS: 500.0000 mg | ORAL_TABLET | Freq: Every evening | ORAL | 2 refills | Status: AC | PRN

## 2024-08-09 DIAGNOSIS — H6982 Other specified disorders of Eustachian tube, left ear: Secondary | ICD-10-CM | POA: Insufficient documentation

## 2024-08-09 DIAGNOSIS — H9312 Tinnitus, left ear: Secondary | ICD-10-CM | POA: Insufficient documentation

## 2024-08-09 DIAGNOSIS — H9202 Otalgia, left ear: Secondary | ICD-10-CM | POA: Insufficient documentation

## 2024-08-09 NOTE — Addendum Note (Signed)
 Addended byBETHA MOCCASIN, Katalyna Socarras on: 08/09/2024 08:00 AM   Modules accepted: Orders

## 2024-08-09 NOTE — Progress Notes (Signed)
 CC: Left ear pain and pressure, left ear tinnitus  HPI:  Felicia Wright is a 73 y.o. female who presents today complaining of left ear pain and pressure for 1 month.  She also complains of a constant left ear buzzing tinnitus for the past year.  She was recently evaluated by her physician, and was treated with Flonase  nasal spray without improvement in her symptoms.  She has no previous history of otitis media or otitis externa.  She denies any hearing difficulty.  She has a history of obstructive sleep apnea, and is currently on a CPAP machine at night.  She has a history of adenotonsillectomy surgery and right nasal polyp removal.  She quit the use of tobacco 40 years ago.  She has a history of arthritis, and is currently on Celebrex.  Past Medical History:  Diagnosis Date   Abnormal heart rhythm    Accidental fall    Allergic rhinitis    Aphthous ulcer    Arthritis    Asthma    Bronchitis    Carpal tunnel syndrome    Cataract    Constipation    Contusion of second toe, right    Cystitis    Depression    Diabetes mellitus type II    diet controlled   Encounter for long-term (current) use of other medications    Fibroid uterus    GERD (gastroesophageal reflux disease)    Hip pain, left    Hyperlipidemia    Hypertension    IBS (irritable bowel syndrome)    Incontinence    Malaise and fatigue    Morbid obesity (HCC)    Normocytic anemia    Oral candidiasis    Overactive bladder    Preventative health care    Reactive depression (situational)    Seizure disorder (HCC)    Sleep apnea    Ulnar neuropathy    Left    Past Surgical History:  Procedure Laterality Date   BREAST BIOPSY  03/07/2012   Procedure: BREAST BIOPSY WITH NEEDLE LOCALIZATION;  Surgeon: Vicenta DELENA Poli, MD;  Location: [facility];  Service: General;  Laterality: Right;  needle localized right breast lumpectomy   BREAST EXCISIONAL BIOPSY Right    right   CARPAL TUNNEL RELEASE     Right    CHOLECYSTECTOMY     COLONOSCOPY N/A 08/12/2015   Procedure: COLONOSCOPY;  Surgeon: Claudis RAYMOND Rivet, MD;  Location: [facility];  Service: Endoscopy;  Laterality: N/A;  955   DILATION AND CURETTAGE OF UTERUS     HEEL SPUR SURGERY     Right   HEMORROIDECTOMY     KNEE ARTHROSCOPY     Right   NASAL SINUS SURGERY  1983   TONSILLECTOMY     TOTAL SHOULDER ARTHROPLASTY Right 02/28/2013   Procedure: RIGHT TOTAL SHOULDER ARTHROPLASTY;  Surgeon: Franky CHRISTELLA Pointer, MD;  Location: [facility];  Service: Orthopedics;  Laterality: Right;    Family History  Problem Relation Age of Onset   Hypertension Mother    Heart failure Mother    Fibroids Sister    Hypertension Brother    Diabetes Brother    Arthritis Other    Coronary artery disease Other    Asthma Other    Breast cancer Neg Hx     Social History:  reports that she quit smoking about 25 years ago. Her smoking use included cigarettes. She started smoking about 35 years ago. She has a 20 pack-year smoking history. She has never  used smokeless tobacco. She reports that she does not drink alcohol and does not use drugs.  Allergies:  Allergies  Allergen Reactions   Sertraline Hcl     Lips swelling & loose stools   Bee Venom    Peach [Prunus Persica]    Strawberry Extract     Prior to Admission medications   Medication Sig Start Date End Date Taking? Authorizing Provider  acetaminophen  (TYLENOL ) 500 MG tablet Take 1,000 mg by mouth at bedtime as needed for pain.    Yes [provider]  amLODipine (NORVASC) 5 MG tablet Take 5 mg by mouth daily. 09/01/21  Yes [provider]  aspirin 81 MG tablet Take 81 mg by mouth at bedtime.   Yes [provider]  Blood Glucose Monitoring Suppl (ONE TOUCH ULTRA 2) w/Device KIT as directed. 06/06/24  Yes [provider]  celecoxib (CELEBREX) 200 MG capsule Take 200 mg by mouth every morning.    Yes [provider]  co-enzyme Q-10 30 MG capsule Take 30 mg by mouth 3  (three) times daily.   Yes [provider]  diphenhydrAMINE  (BENADRYL ) 25 mg capsule Take 50 mg by mouth at bedtime as needed for allergies or sleep.   Yes [provider]  Lancets (ONETOUCH DELICA PLUS LANCET33G) MISC Apply topically daily. 06/06/24  Yes [provider]  LINZESS 72 MCG capsule Take 72 mcg by mouth daily. 09/18/19  Yes [provider]  lisinopril  (ZESTRIL ) 40 MG tablet Take 40 mg by mouth daily. 08/10/21  Yes [provider]  loratadine (CLARITIN REDITABS) 10 MG dissolvable tablet Take 10 mg by mouth daily.   Yes [provider]  magnesium gluconate (MAGONATE) 500 MG tablet Take 500 mg by mouth daily.   Yes [provider]  methocarbamol (ROBAXIN) 500 MG tablet Take 1 tablet (500 mg total) by mouth at bedtime as needed (pain). 08/08/24  Yes Karis Clunes, MD  Misc Natural Products (BEET ROOT PO) Take by mouth.   Yes [provider]  Multiple Vitamin (MULTIVITAMIN WITH MINERALS) TABS Take 1 tablet by mouth daily.   Yes [provider]  omeprazole (PRILOSEC) 20 MG capsule Take 20 mg by mouth daily before breakfast.    Yes [provider]  [facility] ULTRA test strip daily. 06/06/24  Yes [provider]  Propylene Glycol-Glycerin (SOOTHE OP) Place 1 drop into the left eye daily.   Yes [provider]  simvastatin  (ZOCOR ) 40 MG tablet Take 40 mg by mouth at bedtime. 09/18/19  Yes [provider]  tobramycin-dexamethasone  ANTHONEY) ophthalmic ointment as needed (irritation).   Yes [provider]    Blood pressure (!) 147/68, pulse (!) 58, temperature 97.9 F (36.6 C), temperature source Oral, height 5' 3 (1.6 m), weight 179 lb (81.2 kg), SpO2 98%. Exam: General: Communicates without difficulty, well nourished, no acute distress. Head: Normocephalic, no evidence injury, no tenderness, facial buttresses intact without stepoff. Face/sinus: No tenderness to palpation and  percussion. Facial movement is normal and symmetric. Eyes: PERRL, EOMI. No scleral icterus, conjunctivae clear. Neuro: CN II exam reveals vision grossly intact.  No nystagmus at any point of gaze. Ears: Auricles well formed without lesions.  Ear canals are intact without mass or lesion.  No erythema or edema is appreciated.  The TMs are intact without fluid. Nose: External evaluation reveals normal support and skin without lesions.  Dorsum is intact.  Anterior rhinoscopy reveals congested mucosa over anterior aspect of inferior turbinates and intact septum.  No purulence noted. Oral:  Oral cavity and oropharynx are intact, symmetric, without erythema or edema.  Mucosa is moist without lesions. Neck: Full range of motion without pain.  There is no significant lymphadenopathy.  No masses palpable.  Thyroid  bed within normal limits to palpation.  Parotid glands and submandibular glands equal bilaterally without mass.  Trachea is midline. Neuro:  CN 2-12 grossly intact.   Assessment: 1.  Referred left otalgia.  Her ear canals, tympanic membranes, and middle ear spaces are normal.  No middle ear effusion or infection is noted today. 2.  Possible left ear eustachian tube dysfunction. 3.  Subjective left ear tinnitus.  She denies any significant hearing difficulty.  Plan: 1.  The physical exam findings are reviewed with the patient. 2.  The patient is reassured that no otologic abnormality is noted on today's examination. 3.  Robaxin 500 mg p.o. nightly to treat her referred otalgia.  Continue the use of Celebrex. 4.  Outpatient hearing test.  No audiologist is available today. 5.  The strategies to cope with tinnitus are reviewed. 6.  The patient will return for reevaluation in 2 months.  Skylor Hughson W Alma Mohiuddin 08/09/2024, 7:56 AM

## 2024-10-01 ENCOUNTER — Telehealth: Payer: Self-pay

## 2024-10-01 NOTE — Telephone Encounter (Signed)
 CMN received from [facility] regarding pt's CPAP supplies. This is only requiring a provider signature from Landry Ferrari, NP. I will mail back once this has been signed.

## 2024-10-06 DIAGNOSIS — R42 Dizziness and giddiness: Secondary | ICD-10-CM

## 2024-10-08 ENCOUNTER — Encounter (INDEPENDENT_AMBULATORY_CARE_PROVIDER_SITE_OTHER): Payer: Self-pay | Admitting: Otolaryngology

## 2024-10-08 ENCOUNTER — Ambulatory Visit (INDEPENDENT_AMBULATORY_CARE_PROVIDER_SITE_OTHER): Admitting: Audiology

## 2024-10-08 ENCOUNTER — Ambulatory Visit (INDEPENDENT_AMBULATORY_CARE_PROVIDER_SITE_OTHER): Admitting: Otolaryngology

## 2024-10-08 VITALS — BP 164/78 | HR 55 | Temp 98.0°F | Ht 63.0 in | Wt 188.0 lb

## 2024-10-08 DIAGNOSIS — H903 Sensorineural hearing loss, bilateral: Secondary | ICD-10-CM | POA: Diagnosis not present

## 2024-10-08 DIAGNOSIS — H9312 Tinnitus, left ear: Secondary | ICD-10-CM | POA: Diagnosis not present

## 2024-10-08 DIAGNOSIS — H9202 Otalgia, left ear: Secondary | ICD-10-CM

## 2024-10-08 NOTE — Progress Notes (Signed)
  [address] [place], [place] [address] [phone]  Audiological Evaluation    Name: Felicia Wright     DOB:   August 01, 1951      MRN:   [id number]                                                                                     Service Date: 10/08/2024     Accompanied by: self   Patient comes today after Dr. Karis, ENT sent a referral for a hearing evaluation due to concerns with hearing loss.   Symptoms Yes Details  Hearing loss  [x]  Family indicates that she is not hearing well  Tinnitus  []    Ear pain/ infections/pressure  []    Balance problems  [x]  While walking  Noise exposure history  []    Previous ear surgeries  []    Family history of hearing loss  []    Amplification  []    Other  [x]  Heart issues    Otoscopy: Right ear: Clear external ear canal and notable landmarks visualized on the tympanic membrane. Left ear:  Clear external ear canal and notable landmarks visualized on the tympanic membrane.  Tympanometry: Right ear: Normal external ear canal volume with normal middle ear pressure and tympanic membrane compliance (Type A). Findings are suggestive of normal middle ear function. Left ear: Normal external ear canal volume with normal middle ear pressure and tympanic membrane compliance (Type A). Findings are suggestive of normal middle ear function.  Hearing Evaluation The hearing test results were completed under headphones and results are deemed to be of good reliability. Test technique:  conventional    Pure tone Audiometry: Both ears- Normal to moderately severe  sensorineural hearing loss from 125 Hz - 8000 Hz.    Speech Audiometry: Right ear- Speech Reception Threshold (SRT) was obtained at 20 dBHL. Left ear-Speech Reception Threshold (SRT) was obtained at 15 dBHL.   Word Recognition Score Tested using NU-6 (recorded) Right ear: 100% was obtained at a presentation level of 65 dBHL with contralateral masking which is deemed as   excellent. Left ear: 100% was obtained at a presentation level of 65 dBHL with contralateral masking which is deemed as  excellent.   Impression: There is not a significant difference in pure-tone thresholds between ears. There is not a significant difference in the word recognition score in between ears.    Recommendations: Follow up with ENT as scheduled for today. Return for a hearing evaluation if concerns with hearing changes arise or per MD recommendation. Consider a communication needs assessment after medical clearance for hearing aids is obtained.   Cahterine Heinzel MARIE LEROUX-MARTINEZ, AUD

## 2024-10-09 DIAGNOSIS — H903 Sensorineural hearing loss, bilateral: Secondary | ICD-10-CM | POA: Insufficient documentation

## 2024-10-09 NOTE — Progress Notes (Signed)
 Patient ID: Felicia Wright, female   DOB: 1950/11/26, 73 y.o.   MRN: [id number]  Follow up: Referred left otalgia, left ear tinnitus, bilateral hearing loss  Discussed the use of AI scribe software for clinical note transcription with the patient, who gave verbal consent to proceed.  History of Present Illness Felicia Wright is a 73 year old female who presents today for her follow-up evaluation.  She experiences a persistent buzzing sound in her left ear, described as 'like the bees', which is more noticeable in the mornings. The buzzing has not required any medication or surgical intervention.  Her hearing is reportedly declining, particularly at high frequencies.  Her hearing test today shows bilateral symmetric high-frequency sensorineural hearing loss.  Her left referred otalgia has mostly resolved.  She uses a CPAP machine at night, which produces a humming sound that she focuses on to help manage the buzzing noise in her ear.   Exam: General: Communicates without difficulty, well nourished, no acute distress. Head: Normocephalic, no evidence injury, no tenderness, facial buttresses intact without stepoff. Face/sinus: No tenderness to palpation and percussion. Facial movement is normal and symmetric. Eyes: PERRL, EOMI. No scleral icterus, conjunctivae clear. Neuro: CN II exam reveals vision grossly intact.  No nystagmus at any point of gaze. Ears: Auricles well formed without lesions.  Ear canals are intact without mass or lesion.  No erythema or edema is appreciated.  The TMs are intact without fluid. Nose: External evaluation reveals normal support and skin without lesions.  Dorsum is intact.  Anterior rhinoscopy reveals congested mucosa over anterior aspect of inferior turbinates and intact septum.  No purulence noted. Oral:  Oral cavity and oropharynx are intact, symmetric, without erythema or edema.  Mucosa is moist without lesions. Neck: Full range of motion without pain.  There is no  significant lymphadenopathy.  No masses palpable.  Thyroid  bed within normal limits to palpation.  Parotid glands and submandibular glands equal bilaterally without mass.  Trachea is midline. Neuro:  CN 2-12 grossly intact.    Her hearing test shows bilateral symmetric high-frequency sensorineural hearing loss.  Assessment and Plan Assessment & Plan Bilateral sensorineural hearing loss with tinnitus Bilateral sensorineural hearing loss with moderate high-frequency hearing loss, consistent with age-related changes. Tinnitus is present in the left ear, likely related to the hearing loss. Hearing aids may help with both hearing loss and tinnitus.  - The physical exam findings and the hearing test results are reviewed with the patient. - The strategies to cope with tinnitus, including the use of masker, hearing aids, tinnitus retraining therapy, and avoidance of caffeine and alcohol are discussed.  Resolved left ear pain Left ear pain has resolved without the need for muscle relaxants.  - No further intervention required for resolved left ear pain.

## 2024-10-09 NOTE — Progress Notes (Signed)
 Cardiology Office Note    Date:  10/12/2024  ID:  Felicia Wright, DOB 02-05-1951, MRN [id number] Cardiologist: Alvan Carrier, MD { :  History of Present Illness:    Felicia Wright is a 73 y.o. female with past medical history of paroxysmal atrial fibrillation/flutter, HTN, HLD and OSA (on CPAP) who presents to the office today for 32-month follow-up.  She was examined by Dr. Alvan in 07/2024 and reported occasional episodes of lightheadedness which would happen approximately once per week. By review of her chart, a single EKG from 2016 had shown an episode of atrial flutter and she did not have any definitive atrial fibrillation. Was no longer on AV nodal blocking agents given her heart rate in the 50's. She was not on anticoagulation at that time and a follow-up monitor was recommended for further assessment. This showed predominantly normal sinus rhythm with an average heart rate of 51 bpm. She did have 5 episodes of SVT with the longest lasting for 7 beats.  She had a 2.7% PVC burden and 1.1% PAC burden. No changes were made to medications based off the monitor report.  In talking with the patient today, she still reports having occasional episodes of dizziness which can occur sporadically and approximately a few times a month. Can occur while sitting or with activity. She also notes occasional palpitations but no persistent symptoms. She has been off Metoprolol  since earlier this year given baseline bradycardia. She uses her CPAP at night. Limits caffeine intake and does not consume alcohol. No recent exertional chest pain, progressive dyspnea on exertion, orthopnea, PND or pitting edema.  Studies Reviewed:   EKG: EKG is not ordered today.  Event Monitor: 09/2024   14 day monitor   Occasional supraventricular ectopy in the form of isolated PACs, Rare couplets. 5 runs of SVT, longest 7 beats.   Occasional ventricular ectopy in the form of isolated PVCs (2.7% burden). Rare couplets,  triplets   No symptoms reported.     Patch Wear Time:  13 days and 17 hours (2025-09-04T16:50:50-0400 to 2025-09-18T10:07:15-0400)   Patient had a min HR of 31 bpm, max HR of 130 bpm, and avg HR of 51 bpm. Predominant underlying rhythm was Sinus Rhythm. 5 Supraventricular Tachycardia runs occurred, the run with the fastest interval lasting 5 beats with a max rate of 130 bpm, the  longest lasting 7 beats with an avg rate of 110 bpm. Isolated SVEs were occasional (1.1%, 9824), SVE Couplets were rare (<1.0%, 593), and no SVE Triplets were present. Isolated VEs were occasional (2.7%, 25024), VE Couplets were rare (<1.0%, 1110), and  VE Triplets were rare (<1.0%, 76). Ventricular Trigeminy was present.   Physical Exam:   VS:  BP 132/74   Pulse (!) 50   Ht 5' 3 (1.6 m)   Wt 188 lb 3.2 oz (85.4 kg)   SpO2 100%   BMI 33.34 kg/m    Wt Readings from Last 3 Encounters:  10/10/24 188 lb 3.2 oz (85.4 kg)  10/08/24 188 lb (85.3 kg)  08/08/24 179 lb (81.2 kg)     GEN: Well nourished, well developed female appearing in no acute distress NECK: No JVD; No carotid bruits CARDIAC: RRR, no murmurs, rubs, gallops RESPIRATORY:  Clear to auscultation without rales, wheezing or rhonchi  ABDOMEN: Appears non-distended. No obvious abdominal masses. EXTREMITIES: No clubbing or cyanosis. No pitting edema.  Distal pedal pulses are 2+ bilaterally.   Assessment and Plan:   1. Palpitations/PVC's/Bradycardia - Recent monitor showed predominantly normal  sinus rhythm with an average heart rate of 51 bpm with brief episodes of SVT, 2.7% PVC burden and 1.1% PAC burden. Was encouraged to continue to limit caffeine intake. Will arrange for an echocardiogram to assess for any structural abnormalities given her episodic dizziness and PVC's. Would continue to avoid AV nodal blocking agents for now given average HR in the 50's.  2. History of atrial fibrillation - By review of notes, this was an isolated episode in  2016 and most consistent with flutter. Recent monitor showed no recurrence. She has not been on anticoagulation given the time-frame since her arrhythmia.   3. Essential hypertension - BP was initially elevated at 150/76, improved to 132/74 on recheck.  Continue current medical therapy for now with Amlodipine 5mg  daily and Lisinopril  40mg  daily.   4. Mixed hyperlipidemia - Followed by her PCP. She has been on Simvastatin  40mg  daily but is also on Amlodipine 5mg  daily. Would recommend either reducing Simvastatin  to 20mg  daily or switching to Crestor.   5. Sleep apnea, unspecified type - Continued compliance with CPAP encouraged.    Signed, Laymon CHRISTELLA Qua, PA-C

## 2024-10-10 ENCOUNTER — Encounter: Payer: Self-pay | Admitting: Student

## 2024-10-10 ENCOUNTER — Ambulatory Visit: Attending: Student | Admitting: Student

## 2024-10-10 VITALS — BP 132/74 | HR 50 | Ht 63.0 in | Wt 188.2 lb

## 2024-10-10 DIAGNOSIS — Z8679 Personal history of other diseases of the circulatory system: Secondary | ICD-10-CM | POA: Diagnosis not present

## 2024-10-10 DIAGNOSIS — G4730 Sleep apnea, unspecified: Secondary | ICD-10-CM

## 2024-10-10 DIAGNOSIS — R001 Bradycardia, unspecified: Secondary | ICD-10-CM | POA: Diagnosis not present

## 2024-10-10 DIAGNOSIS — I10 Essential (primary) hypertension: Secondary | ICD-10-CM | POA: Diagnosis not present

## 2024-10-10 DIAGNOSIS — I493 Ventricular premature depolarization: Secondary | ICD-10-CM

## 2024-10-10 DIAGNOSIS — R002 Palpitations: Secondary | ICD-10-CM | POA: Diagnosis not present

## 2024-10-10 DIAGNOSIS — E782 Mixed hyperlipidemia: Secondary | ICD-10-CM | POA: Diagnosis not present

## 2024-10-10 NOTE — Patient Instructions (Signed)
 Medication Instructions:  Your physician recommends that you continue on your current medications as directed. Please refer to the Current Medication list given to you today.  *If you need a refill on your cardiac medications before your next appointment, please call your pharmacy*  Lab Work: None If you have labs (blood work) drawn today and your tests are completely normal, you will receive your results only by: MyChart Message (if you have MyChart) OR A paper copy in the mail If you have any lab test that is abnormal or we need to change your treatment, we will call you to review the results.  Testing/Procedures: Your physician has requested that you have an echocardiogram. Echocardiography is a painless test that uses sound waves to create images of your heart. It provides your doctor with information about the size and shape of your heart and how well your heart's chambers and valves are working. This procedure takes approximately one hour. There are no restrictions for this procedure. Please do NOT wear cologne, perfume, aftershave, or lotions (deodorant is allowed). Please arrive 15 minutes prior to your appointment time.  Please note: We ask at that you not bring children with you during ultrasound (echo/ vascular) testing. Due to room size and safety concerns, children are not allowed in the ultrasound rooms during exams. Our front office staff cannot provide observation of children in our lobby area while testing is being conducted. An adult accompanying a patient to their appointment will only be allowed in the ultrasound room at the discretion of the ultrasound technician under special circumstances. We apologize for any inconvenience.   Follow-Up: At [facility], you and your health needs are our priority.  As part of our continuing mission to provide you with exceptional heart care, our providers are all part of one team.  This team includes your primary Cardiologist  (physician) and Advanced Practice Providers or APPs (Physician Assistants and Nurse Practitioners) who all work together to provide you with the care you need, when you need it.  Your next appointment:   6 month(s)  Provider:   You may see Dina Rich, MD or one of the following Advanced Practice Providers on your designated Care Team:   Randall An, PA-C  Scotesia Zephyr, [place] Jacolyn Reedy, [place]     We recommend signing up for the patient portal called "MyChart".  Sign up information is provided on this After Visit Summary.  MyChart is used to connect with patients for Virtual Visits (Telemedicine).  Patients are able to view lab/test results, encounter notes, upcoming appointments, etc.  Non-urgent messages can be sent to your provider as well.   To learn more about what you can do with MyChart, go to [email or web address].   Other Instructions

## 2024-10-12 ENCOUNTER — Encounter: Payer: Self-pay | Admitting: Student

## 2024-10-23 ENCOUNTER — Ambulatory Visit: Payer: Self-pay | Admitting: Cardiology

## 2024-11-05 ENCOUNTER — Ambulatory Visit: Payer: Self-pay | Admitting: Student

## 2024-11-05 ENCOUNTER — Ambulatory Visit (HOSPITAL_COMMUNITY)
Admission: RE | Admit: 2024-11-05 | Discharge: 2024-11-05 | Disposition: A | Discharge status: Home / Self Care | Source: Ambulatory Visit | Attending: Student | Admitting: Student

## 2024-11-05 DIAGNOSIS — E119 Type 2 diabetes mellitus without complications: Secondary | ICD-10-CM | POA: Insufficient documentation

## 2024-11-05 DIAGNOSIS — E785 Hyperlipidemia, unspecified: Secondary | ICD-10-CM | POA: Insufficient documentation

## 2024-11-05 DIAGNOSIS — I493 Ventricular premature depolarization: Secondary | ICD-10-CM | POA: Diagnosis not present

## 2024-11-05 DIAGNOSIS — G4730 Sleep apnea, unspecified: Secondary | ICD-10-CM | POA: Diagnosis not present

## 2024-11-05 DIAGNOSIS — I10 Essential (primary) hypertension: Secondary | ICD-10-CM | POA: Diagnosis not present

## 2024-11-05 DIAGNOSIS — R002 Palpitations: Secondary | ICD-10-CM | POA: Insufficient documentation

## 2024-11-05 LAB — ECHOCARDIOGRAM COMPLETE
AR max vel: 1.81 cm2
AV Area VTI: 2.01 cm2
AV Area mean vel: 1.83 cm2
AV Mean grad: 7 mmHg
AV Peak grad: 13.1 mmHg
Ao pk vel: 1.81 m/s
Area-P 1/2: 2.76 cm2
Calc EF: 56.7 %
S' Lateral: 3.9 cm
Single Plane A2C EF: 59.1 %
Single Plane A4C EF: 56 %

## 2024-11-05 NOTE — Progress Notes (Signed)
*  PRELIMINARY RESULTS* Echocardiogram 2D Echocardiogram has been performed.  Felicia Wright 11/05/2024, 3:05 PM

## 2024-11-14 ENCOUNTER — Encounter: Payer: Self-pay | Admitting: *Deleted

## 2024-11-14 NOTE — Progress Notes (Signed)
 Felicia Wright                                          MRN: [id number]   11/14/2024   The [facility] Quality Team Specialist reviewed this patient medical record for the purposes of chart review for care gap closure. The following were reviewed: abstraction for care gap closure-controlling blood pressure.    [facility] Quality Team
